# Patient Record
Sex: Female | Born: 1955
Health system: Southern US, Community
[De-identification: ages and names within clinical notes are randomized; demographics above are authoritative.]

## PROBLEM LIST (undated history)

## (undated) DIAGNOSIS — K5909 Other constipation: Secondary | ICD-10-CM

## (undated) DIAGNOSIS — T4145XA Adverse effect of unspecified anesthetic, initial encounter: Secondary | ICD-10-CM

## (undated) DIAGNOSIS — T8859XA Other complications of anesthesia, initial encounter: Secondary | ICD-10-CM

## (undated) DIAGNOSIS — F419 Anxiety disorder, unspecified: Secondary | ICD-10-CM

## (undated) DIAGNOSIS — K219 Gastro-esophageal reflux disease without esophagitis: Secondary | ICD-10-CM

## (undated) DIAGNOSIS — F32A Depression, unspecified: Secondary | ICD-10-CM

## (undated) DIAGNOSIS — F329 Major depressive disorder, single episode, unspecified: Secondary | ICD-10-CM

## (undated) DIAGNOSIS — I10 Essential (primary) hypertension: Secondary | ICD-10-CM

## (undated) HISTORY — PX: COLONOSCOPY: SHX174

## (undated) HISTORY — PX: ABDOMINAL HYSTERECTOMY: SHX81

## (undated) HISTORY — PX: KNEE ARTHROPLASTY: SHX992

## (undated) HISTORY — DX: Depression, unspecified: F32.A

## (undated) HISTORY — DX: Essential (primary) hypertension: I10

## (undated) HISTORY — PX: BREAST SURGERY: SHX581

## (undated) HISTORY — PX: FOOT SURGERY: SHX648

## (undated) HISTORY — DX: Gastro-esophageal reflux disease without esophagitis: K21.9

## (undated) HISTORY — DX: Major depressive disorder, single episode, unspecified: F32.9

## (undated) HISTORY — DX: Anxiety disorder, unspecified: F41.9

## (undated) HISTORY — PX: COLON SURGERY: SHX602

---

## 1998-03-12 ENCOUNTER — Ambulatory Visit (HOSPITAL_COMMUNITY): Admission: RE | Admit: 1998-03-12 | Discharge: 1998-03-12 | Payer: Self-pay | Admitting: *Deleted

## 1998-05-27 ENCOUNTER — Ambulatory Visit (HOSPITAL_COMMUNITY): Admission: RE | Admit: 1998-05-27 | Discharge: 1998-05-27 | Payer: Self-pay | Admitting: *Deleted

## 1998-06-04 ENCOUNTER — Ambulatory Visit (HOSPITAL_COMMUNITY): Admission: RE | Admit: 1998-06-04 | Discharge: 1998-06-04 | Payer: Self-pay | Admitting: *Deleted

## 1998-08-25 ENCOUNTER — Encounter: Payer: Self-pay | Admitting: *Deleted

## 1998-08-25 ENCOUNTER — Emergency Department (HOSPITAL_COMMUNITY): Admission: EM | Admit: 1998-08-25 | Discharge: 1998-08-25 | Payer: Self-pay | Admitting: Emergency Medicine

## 1999-03-04 ENCOUNTER — Ambulatory Visit (HOSPITAL_COMMUNITY): Admission: RE | Admit: 1999-03-04 | Discharge: 1999-03-04 | Payer: Self-pay | Admitting: *Deleted

## 1999-03-04 ENCOUNTER — Encounter: Payer: Self-pay | Admitting: *Deleted

## 1999-10-22 ENCOUNTER — Encounter (INDEPENDENT_AMBULATORY_CARE_PROVIDER_SITE_OTHER): Payer: Self-pay

## 1999-10-22 ENCOUNTER — Inpatient Hospital Stay (HOSPITAL_COMMUNITY): Admission: RE | Admit: 1999-10-22 | Discharge: 1999-10-25 | Payer: Self-pay | Admitting: Obstetrics and Gynecology

## 2000-12-30 ENCOUNTER — Emergency Department (HOSPITAL_COMMUNITY): Admission: EM | Admit: 2000-12-30 | Discharge: 2000-12-30 | Payer: Self-pay | Admitting: Emergency Medicine

## 2000-12-30 ENCOUNTER — Encounter: Payer: Self-pay | Admitting: Emergency Medicine

## 2001-04-22 ENCOUNTER — Ambulatory Visit (HOSPITAL_BASED_OUTPATIENT_CLINIC_OR_DEPARTMENT_OTHER): Admission: RE | Admit: 2001-04-22 | Discharge: 2001-04-22 | Payer: Self-pay | Admitting: Orthopedic Surgery

## 2001-08-09 ENCOUNTER — Encounter: Payer: Self-pay | Admitting: Internal Medicine

## 2001-08-09 ENCOUNTER — Encounter: Admission: RE | Admit: 2001-08-09 | Discharge: 2001-08-09 | Payer: Self-pay | Admitting: Internal Medicine

## 2003-09-28 ENCOUNTER — Encounter: Admission: RE | Admit: 2003-09-28 | Discharge: 2003-09-28 | Payer: Self-pay | Admitting: Emergency Medicine

## 2003-11-12 ENCOUNTER — Encounter: Admission: RE | Admit: 2003-11-12 | Discharge: 2003-11-12 | Payer: Self-pay | Admitting: Orthopaedic Surgery

## 2003-12-04 ENCOUNTER — Encounter: Admission: RE | Admit: 2003-12-04 | Discharge: 2003-12-04 | Payer: Self-pay | Admitting: Orthopaedic Surgery

## 2004-06-30 ENCOUNTER — Other Ambulatory Visit: Admission: RE | Admit: 2004-06-30 | Discharge: 2004-06-30 | Payer: Self-pay | Admitting: Obstetrics and Gynecology

## 2004-10-07 ENCOUNTER — Ambulatory Visit (HOSPITAL_COMMUNITY): Admission: RE | Admit: 2004-10-07 | Discharge: 2004-10-07 | Payer: Self-pay | Admitting: General Surgery

## 2004-10-07 ENCOUNTER — Ambulatory Visit (HOSPITAL_BASED_OUTPATIENT_CLINIC_OR_DEPARTMENT_OTHER): Admission: RE | Admit: 2004-10-07 | Discharge: 2004-10-07 | Payer: Self-pay | Admitting: General Surgery

## 2004-10-07 ENCOUNTER — Encounter (INDEPENDENT_AMBULATORY_CARE_PROVIDER_SITE_OTHER): Payer: Self-pay | Admitting: Specialist

## 2005-08-27 ENCOUNTER — Inpatient Hospital Stay (HOSPITAL_COMMUNITY): Admission: EM | Admit: 2005-08-27 | Discharge: 2005-08-31 | Payer: Self-pay | Admitting: Emergency Medicine

## 2005-09-25 ENCOUNTER — Ambulatory Visit: Payer: Self-pay | Admitting: Pulmonary Disease

## 2005-10-05 ENCOUNTER — Encounter: Admission: RE | Admit: 2005-10-05 | Discharge: 2005-10-05 | Payer: Self-pay | Admitting: Emergency Medicine

## 2005-10-26 ENCOUNTER — Ambulatory Visit: Payer: Self-pay | Admitting: Pulmonary Disease

## 2005-11-22 ENCOUNTER — Ambulatory Visit: Payer: Self-pay | Admitting: Pulmonary Disease

## 2005-11-22 ENCOUNTER — Ambulatory Visit (HOSPITAL_BASED_OUTPATIENT_CLINIC_OR_DEPARTMENT_OTHER): Admission: RE | Admit: 2005-11-22 | Discharge: 2005-11-22 | Payer: Self-pay | Admitting: Pulmonary Disease

## 2007-06-17 ENCOUNTER — Encounter: Admission: RE | Admit: 2007-06-17 | Discharge: 2007-06-17 | Payer: Self-pay | Admitting: Emergency Medicine

## 2009-05-09 ENCOUNTER — Encounter: Admission: RE | Admit: 2009-05-09 | Discharge: 2009-05-09 | Payer: Self-pay | Admitting: Allergy

## 2009-05-15 ENCOUNTER — Encounter: Admission: RE | Admit: 2009-05-15 | Discharge: 2009-05-15 | Payer: Self-pay | Admitting: Emergency Medicine

## 2009-05-16 ENCOUNTER — Encounter: Admission: RE | Admit: 2009-05-16 | Discharge: 2009-05-16 | Payer: Self-pay | Admitting: Emergency Medicine

## 2010-05-25 ENCOUNTER — Encounter: Payer: Self-pay | Admitting: Interventional Radiology

## 2010-05-25 ENCOUNTER — Encounter: Payer: Self-pay | Admitting: Emergency Medicine

## 2010-07-15 ENCOUNTER — Other Ambulatory Visit: Payer: Self-pay | Admitting: Gastroenterology

## 2010-07-15 ENCOUNTER — Ambulatory Visit
Admission: RE | Admit: 2010-07-15 | Discharge: 2010-07-15 | Disposition: A | Discharge status: Home / Self Care | Payer: Self-pay | Source: Ambulatory Visit | Attending: Gastroenterology | Admitting: Gastroenterology

## 2010-07-15 DIAGNOSIS — R110 Nausea: Secondary | ICD-10-CM

## 2010-07-15 MED ORDER — IOHEXOL 300 MG/ML  SOLN
100.0000 mL | Freq: Once | INTRAMUSCULAR | Status: AC | PRN
  Administered 2010-07-15: 100 mL via INTRAVENOUS

## 2010-07-18 ENCOUNTER — Other Ambulatory Visit: Payer: Self-pay | Admitting: Family Medicine

## 2010-07-18 DIAGNOSIS — N949 Unspecified condition associated with female genital organs and menstrual cycle: Secondary | ICD-10-CM

## 2010-07-22 ENCOUNTER — Ambulatory Visit
Admission: RE | Admit: 2010-07-22 | Discharge: 2010-07-22 | Disposition: A | Discharge status: Home / Self Care | Payer: Federal, State, Local not specified - PPO | Source: Ambulatory Visit | Attending: Family Medicine | Admitting: Family Medicine

## 2010-07-22 DIAGNOSIS — N949 Unspecified condition associated with female genital organs and menstrual cycle: Secondary | ICD-10-CM

## 2010-09-19 NOTE — Discharge Summary (Signed)
[facility] [place]  Patient:    Danielle Anthony, Danielle Anthony                      MRN: [id number] Adm. Date:  [id number] Disc. Date: 10/25/99 Attending:  Marcelle Overlie                           Discharge Summary  ADMISSION DIAGNOSIS:          Symptomatic uterine fibroids.  DISCHARGE DIAGNOSIS:          Symptomatic uterine fibroids.  HISTORY OF PRESENT ILLNESS:   The patient is a 55 year old, gravida 0, with a longstanding history of symptomatic fibroids, who complained of heavy menses with clots, lower abdominal, back, and leg pain, which had previously required orthopedic evaluation, as well as episodes of acute urinary retention.  Of note, the patient is a Curator and refuses blood transfusion.  ALLERGIES:                    None.  MEDICATIONS:                  None.  PAST MEDICAL HISTORY:         As above in the history of present illness.  FAMILY HISTORY:               Unremarkable.  HOSPITAL COURSE:              The patient underwent a total abdominal hysterectomy.  Her estimated blood loss was 450 cc at the time of surgery. The patient did very well postoperatively.  Her postoperative hematocrit settled at 29.  On postoperative day #2, the patient did have a low-grade temperature to 100.7 degrees.  Her physical exam was only remarkable for some upper respiratory congestion and the patient was started on amoxicillin, as well as a decongestant.  By postoperative day #3, the patient had been afebrile, had stable vital signs, was tolerating a regular diet, had flatus, had a small bowel movement, and was tolerating her pain medications well.  DISPOSITION:                  She was discharged home on postoperative day #3 in excellent condition.  She was advised to call if she has nausea, vomiting, abdominal pain, redness or drainage from incision site, or temperature greater than 100.5 degrees.  FOLLOW-UP:                    She will follow up in the  office in four weeks.  ACTIVITIES:                   She was advised no driving for two to three weeks.  DISCHARGE MEDICATIONS:        1. Demerol 50 mg one p.o. q.3-4h. p.r.n. pain,                                  #60.                               2. Amoxicillin 250 mg p.o. t.i.d. for seven  days, #21.                               3. Anaprox 275 mg p.o. b.i.d., #60. DD:  10/25/99 TD:  10/26/99 Job: [id number]

## 2010-09-19 NOTE — Op Note (Signed)
NAME:  Danielle Anthony, Danielle Anthony               ACCOUNT NO.:  [id number]   MEDICAL RECORD NO.:  [id number]          PATIENT TYPE:  AMB   LOCATION:  [facility]                         FACILITY:  [facility]   PHYSICIAN:  Adolph Pollack, M.D.DATE OF BIRTH:  04/23/1956   DATE OF PROCEDURE:  10/07/2004  DATE OF DISCHARGE:                                 OPERATIVE REPORT   PREOPERATIVE DIAGNOSIS:  Anorectal masses.   POSTOPERATIVE DIAGNOSIS:  Anorectal masses.   PROCEDURES:  1.  Exam under anesthesia.  2.  Anoscopy.  3.  Transanal excision of right rectal mass.  4.  Transanal excision of left anal mass.   SURGEON:  Adolph Pollack, M.D.   ANESTHESIA:  General plus 0.5% Marcaine with epinephrine local.   INDICATIONS:  This is a 55 year old female who had colorectal cancer  screening by Dr. Loreta Ave, and she had a prominent anal papilla and anal mass.  It was prolapsing.  Indeed, on exam in the office, she had this palpable  prolapsing mass on the right side and a smaller mass on the left, and she  now presents for transanal excision of these.  The procedure and risks were  discussed with her.   TECHNIQUE:  She was seen in the holding area, then brought to the operating  room, placed supine on the operating table, and a general anesthetic was  administered.  She was placed in the lithotomy position, and the perianal  area was sterilely prepped and draped.  A Betadine-soaked sponge was then  inserted into the rectum. Digital rectal exam was performed, and the left-  sided was palpated, and the right-sided mass was able to be brought through  the anus.  I addressed the left-sided mass first.  The anoscope was  inserted, and the approximately 8-10 mm mass was seen just distal to the  dentate line.  I anesthetized the mucosa and submucosa with the local  anesthetic and excised the mass with the cautery, and the base was  hemostatic.  The mass was sent to pathology.   Next, I placed the anoscope back in  and grasped the right-sided rectal-type  mass which looks like potentially hypertrophied rectal mucosa.  I went  ahead, and by way of the closed technique, I applied a Kelly clamp to its  base, excised it sharply, and then reapproximated the mucosa with a baseball-  type stitch for hemostasis as well as reapproximation.  This mass was sent  separately to pathology.  I did a perianal block with the local anesthetic  at that time.  The areas were hemostatic.  The sponge was removed from the  anus.  A piece of Gelfoam was placed into the anorectal canal.  A bulky  dressing was applied.   She tolerated the procedures well without any apparent complications.  She  will be given discharge instructions.  She was taken to the recovery room in  satisfactory condition and will follow up with me in the office in 1-2  weeks.      TJR/MEDQ  D:  10/07/2004  T:  10/07/2004  Job:  [id number]  cc:   Anselmo Rod, M.D.  [address]  [address]  [place]  [place] [address]  Fax: [phone]   Reuben Likes, M.D.  [address]  [place]  [place] [address]  Fax: [phone]

## 2010-09-19 NOTE — Discharge Summary (Signed)
NAME:  Danielle Anthony, Danielle Anthony NO.:  [id number]   MEDICAL RECORD NO.:  [id number]          PATIENT TYPE:  INP   LOCATION:  5708                         FACILITY:  [facility]   PHYSICIAN:  Angelia Mould. Derrell Lolling, M.D.DATE OF BIRTH:  1955/12/13   DATE OF ADMISSION:  08/26/2005  DATE OF DISCHARGE:  08/31/2005                                 DISCHARGE SUMMARY   DISCHARGE PHYSICIAN:  Angelia Mould. Derrell Lolling, M.D.   CHIEF COMPLAINT/REASON FOR ADMISSION:  Danielle Anthony is a 55 year old female  patient with a two day history of worsening rectal pain and low-grade  fevers.  She is also being treated outpatient for bronchitis and was on  Levaquin prior to admission.  She has been having small bowel movements that  have been extremely painful, and on passing mucous, she has had prior  transanal excision of benign rectal mass in January 2006 by Danielle Anthony.  She presented to the ER because of pain, and CT showed an ill-defined  perirectal densities consistent with perirectal abscess.  The patient also  has a fairly large ovarian cyst.   PHYSICAL EXAMINATION:  VITAL SIGNS:  Temperature 100, blood pressure 101/50,  heart rate 72, respirations 18.  Examination was unremarkable except for the  following.  PERIRECTAL:  No gross mass or fluctuance.  Patient mildly tender to  palpation in the left perianal region.  She was exquisitely tender with any  attempts at visual/rectal examination.  Because of this, Danielle Anthony was  unable to fully examine the area due to the fever, CT findings and white  count of 12,300.  Danielle Anthony best felt that direct examination under  anesthesia with possible drainage evac of abscess was indicated.   HISTORY OF PRESENT ILLNESS:  On the date of admission, the patient was taken  to the OR where she did undergo a drainage of an internal perirectal  abscess.  A large amount of foul smelling purulent material was evacuated.  Cavity was explored with a finger, and there was no  tracking noted.  The  area was cleansed with saline, and a Penrose drain was left in place.  The  patient was deferred to Danielle Anthony note for further details.  The patient  was subsequently transferred to the PACU for recovery and to the general  floor for admission.   ADMISSION DIAGNOSES:  1.  Deep perirectal abscess status post intraoperative drainage of same.  2.  Recent bronchitis on Levaquin.  3.  Hypertension.  4.  History of diverticulitis.  5.  Chronic constipation.  6.  Large ovarian cyst on CT scan.  Clarification:  There are large      bilateral ovarian cysts seen on CT scan.   HOSPITAL COURSE:  The patient was admitted to the General Medical floor  postoperatively where she was continued on IV fluids and IV Zosyn for  abscess.  She was continued on Avelox in substitution for her Levaquin for  her recent issues related to bronchitis.   On postop day #1, August 28, 2005, the patient spiked a temperature of 103.4  and was running temperatures of  around 101.4.  She had a wet sounding cough.  She was passing flatus.  The Penrose drain had fallen out.  There was no  drainage coming from the perirectal area.  She was somewhat tender of her  left gluteal region, but there was no fluctuance or mass.  Because of the  increasing fever, there was some concern that there may be an additional  pocket of perirectal abscess left.  A CT was done, and showed that the prior  area of abscess had subsequently resolved.  There were new patchy areas of  brown glass opacity in the right middle lobe and right lower lobe, possibly  affecting either edema or early infiltrate.  Based on these findings,  aggressive pulmonary toileting was initiated.   Throughout the remainder of the weekend, the patient continues to improve.  Her respiratory status improved.  She was eventually weaned off oxygen.  A  repeat chest x-ray done on August 29, 2005, showed no active lung disease.  By August 31, 2005, the  patient had no further perirectal pain.  She was  tolerating sitz baths.  Blood cultures were negative.  Urine cultures were  negative.  White count by August 29, 2005, was down to 4300.  The patient was  afebrile.  The only complaint up to this point was a two day history of  increasing frequency of diarrhea and loose stools.  Painless in nature.  No  blood.  It was felt at this time that the patient was probably having  diarrhea secondary to combination of Avelox and Zosyn therapy.  Since she  had no fever or problems with leukocytosis, it was felt that she probably  did not have C. difficile colitis, but specimens were obtained prior to her  discharge.  IV fluids were originally increased to 100 an hour to aid in  hydration, and the patient was able to tolerate a diet without nausea or  vomiting.  Her Zosyn and Avelox were discontinued.  She was started on Cipro  and Flagyl with Cipro to continue for three days after discharge and Flagyl  for several days longer for a total of seven days duration to help cover not  only anaerobes from the perirectal abscess but to cover for C. difficile  colitis empirically as well.   FINAL DISCHARGE DIAGNOSES:  1.  Rectal pain due to perirectal abscess, deep, status post I&D of same on      August 27, 2005.  2.  Fever and cough with recent acute bronchitis and exacerbation of same in      hospital, now resolved.  3.  Leukocytosis, resolved.  4.  Hypertension, controlled.  5.  Bilateral ovarian cyst seen on CT scan.   DISCHARGE MEDICATIONS:  1.  The patient will resume home medications as prior to admission, except,      she has been instructed to stop Levaquin.  2.  She will be on Cipro 500 mg b.i.d. for three more days.  3.  She will be on Flagyl 500 mg t.i.d. for seven days.   DISCHARGE INSTRUCTIONS:  1.  Diet:  No restrictions.  2.  Activity:  No shower or bath. 3.  She will return to work in one week if her diarrhea is better.  4.  Wound  care:  She is to continue Sitz baths t.i.d.  There is no drainage      and no areas to pack since the I&D itself was intrarectal.  Pain  management was Tylenol or ibuprofen over-the-counter.   FOLLOWUP:  She has an appointment to see Danielle Anthony on Monday, May 7, at  11:30 in the morning.  The patient has been instructed, as well, to call if  she has worsening diarrhea, dizziness and weakness or increasing fever.  That could indicated that there is something more serious occurring with the  diarrhea process.     Allison L. Rennis Harding, N.P.      Angelia Mould. Derrell Lolling, M.D.  Electronically Signed   ALE/MEDQ  D:  08/31/2005  T:  09/01/2005  Job:  [id number]

## 2010-09-19 NOTE — Op Note (Signed)
[facility]  Patient:    Danielle Anthony, Danielle Anthony Visit Number: [id number] MRN: [id number]          Service Type: DSU Location: [facility] Attending Physician:  Milly Jakob Dictated by:   Harvie Junior, M.D. Proc. Date: 04/22/01 Admit Date:  04/22/2001                             Operative Report  PREOPERATIVE DIAGNOSIS:  Medial meniscal tear.  POSTOPERATIVE DIAGNOSES: 1. Medial meniscal tear. 2. Osteochondral injury of medial femoral condyle. 3. Chondromalacia of patella.  PRINCIPAL PROCEDURES: 1. Debridement of posterior horn and mid body of medial meniscus. 2. Debridement of chondromalacia of medial femoral condyle. 3. Debridement of under surface of patella.  SURGEON:  Harvie Junior, M.D.  ASSISTANT:  Currie Paris. Thedore Mins.  ANESTHESIA:  General.  BRIEF HISTORY:  The patient is a 55 year old female with a long history of having had intermittent knee effusion with pain in the knee and popping on the medial side.  She was examined in the office and felt to have a medial meniscal tear.  She is taken to the operating room for evaluation and treatment of this.  DESCRIPTION OF PROCEDURE:  The patient was taken to the operating room and adequate anesthesia was obtained with general anesthetic.  The patient was placed supine on the operating table.  The right leg was then prepped and draped in the usual sterile fashion, and following this, routine arthroscopic examination of the knee revealed there was an obvious tear of the medial meniscus on the __________ side.  This was debrided back to a smooth and stable rim.  Attention was then turned towards the medial femoral condyle which had some grade 2 changes which were debrided.  Attention was then turned up into the patellofemoral joint where there was noted to be some grade 2 and 3 changes on the under surface of the patella. The notch was identified and noted to have a normal ACL and PCL.   The lateral joint was then identified and noted to be within normal limits.  Following this, the patella was debrided back to a smooth and stable rim.  The knee was then copiously irrigated, suctioned dry, and a final check was made for any loose fragment pieces and none were seen.  At this point, the knee was copiously irrigated and suctioned dry.  Portals were closed with a bandage and a sterile compressive dressing applied.  The patient was taken to the recovery room where she was noted to be in satisfactory condition.  Estimated blood loss for the procedure was none. Dictated by:   Harvie Junior, M.D. Attending Physician:  Milly Jakob DD:  04/22/01 TD:  04/24/01 Job: [id number]

## 2010-09-19 NOTE — Procedures (Signed)
NAME:  Danielle Anthony, Danielle Anthony               ACCOUNT NO.:  [id number]   MEDICAL RECORD NO.:  [id number]            PATIENT TYPE:   LOCATION:  SLEEP CENTER                   FACILITY:   PHYSICIAN:  Coralyn Helling, M.D.      DATE OF BIRTH:  Oct 31, 1955   DATE OF STUDY:                              NOCTURNAL POLYSOMNOGRAM   DATE OF EXAMINATION:  November 22, 2005.   FACILITY:  [facility]   REFERRING PHYSICIAN:  Dr. Coralyn Helling.   INDICATIONS FOR STUDY:  This is an individual who has loud snoring, daytime  sleepiness, and presents to the sleep lab for evaluation of obstructive  sleep apnea.   MEDICATIONS:  Benicar, Ambien, and citalopram.   EPWORTH SLEEPINESS SCORE:  Epworth score is 3.   SLEEP ARCHITECTURE:  Sleep architecture total recording time was 439  minutes. Total sleep time was 257 minutes. Sleep efficiency was 59% which is  reduced, although it appeared that the patient had frequent respiratory  events as cause of her sleep disruption and difficulty with maintaining  sleep. The patient was observed in all stages of sleep. However, there was a  reduction of percentage of slow wave sleep at 15% of the study and a  reduction in the percentage of REM sleep at 13% of the study. Sleep latency  was 62 minutes which was prolonged. REM latency was 342.5 minutes which was  prolonged. The  patient spent the majority of time in the nonsupine  position.   RESPIRATORY DATA:  A split night protocol was ordered. However, the patient  did not meet split night criteria. The average respiratory rate was 14. The  apnea hypopnea index was 13.5. The events were exclusively obstructive in  nature. There was a significant REM effect with a REM apnea hypopnea index  of 67.8 versus a non-REM apnea hypopnea index of 5.1. Moderate snoring was  noted by the technician.   OXYGEN DATA:  The oxygen saturation nadir was 89%. The baseline oxygenation  was 98%. The patient spent a total of 422.5 minutes with  an oxygen  saturation between 91 and 100% and 0.2 minutes with an oxygen saturation  between 81 and 90%.   CARDIAC DATA:  EKG showed normal sinus rhythm.   MOVEMENT PARASOMNIA:  The periodic limb movement index was 4.9.   IMPRESSION:  This study shows evidence for moderate obstructive sleep apnea  with an apnea hypopnea index of 13.5 with an oxygen saturation nadir of 89%.  Of note is that there was a significant REM effect. Additionally the  patient had minimal amount of REM sleep, and therefore the severity of sleep  apnea may be underestimated. She also appeared to have frequent sleep  disruption related to respiratory events.      Coralyn Helling, M.D.  Diplomat, Biomedical engineer of Sleep Medicine  Electronically Signed     VS/MEDQ  D:  11/30/2005 11:51:08  T:  11/30/2005 13:01:21  Job:  [id number]

## 2010-09-19 NOTE — Op Note (Signed)
[facility]  Patient:    Danielle Anthony, Danielle Anthony                      MRN: [id number] Proc. Date: 10/22/99 Adm. Date:  [id number] Attending:  Marcelle Overlie                           Operative Report  PREOPERATIVE DIAGNOSIS:       Symptomatic uterine fibroids.  POSTOPERATIVE DIAGNOSIS:      Symptomatic uterine fibroids.  PROCEDURE:                    Total abdominal hysterectomy.  SURGEON:                      Marcelle Overlie, M.D.  ASSISTANT:                    Luvenia Redden, M.D.  ANESTHESIA:                   General anesthesia.  ESTIMATED BLOOD LOSS:         450 cc.  PATHOLOGY:                    Uterus and cervix.  COMPLICATIONS:                None.  DRAINS:                       Foley catheter.  FINDINGS:                     A 16 week size fibroid uterus.  DESCRIPTION OF PROCEDURE:     The patient was taken to the operating room where she was intubated without difficulty.  She was supine on the operating table.  A Foley catheter was placed in the bladder.  The abdomen was prepped and draped in the usual sterile fashion.  Using a scalpel, a small low transverse incision was made and carried down to the fascia using electrocautery with good hemostasis.  The fascia was scored in the midline and then extended laterally using Mayo scissors. A Pfannenstiel incision was created.  The rectus muscles were dissected in the middle and the peritoneum was lifted up and picked up using Metzenbaum scissors.  The peritoneal incision was then extended superiorly and inferiorly with good visualization of the bladder.  Inspection on palpation, the pelvis revealed a grossly distorted uterus which was approximately 16 weeks in size.  There was a  very large 10 cm fibroid protruding anteriorly and the adnexa were not easily visualized, but were posterior to the uterus.  We then transected the round ligament on the right side and the anterior leaf of the  broad ligament was then  carried down to the level of the cervix.  It was then decided to proceed with a  myomectomy in order to proceed with better visualization of her pedicles. Using electrocautery and blunt and sharp dissection, a myomectomy was performed with removal of the 10 cm fibroid as well as the three smaller fibroids.  This was done and after that there was much better visualization of the pelvis.  We then proceeded with turning our attention to the left side of the pelvis where the round ligament was identified.  The round ligament was transected and ligated using 0  Vicryl suture.  Anterior and posterior leafs of the broad ligament were incised. The ureter was identified in its normal location.  The ovary was also noted to e slightly adherent to a large posterior fibroid as well as surrounding peritoneum and these adhesions were then freed using blunt and sharp dissection.  The triple pedicle was then clamped using Mayo scissors and the pedicle was cut and suture  ligated using 0 Vicryl suture.  We then traveled around the fibroid using a series of clamps and suture ligature using 0 Vicryl suture on the left side. Attention was then turned to the right side where in a similar fashion, we identified the  ovary and tube on the right side and both were covered with peritoneum.  The ureter was identified in its normal location and parastalsis was identified on the right side and triple pedicle was then clamped on the right side and the pedicle was hen cut and suture ligated using 0 Vicryl suture.  We then turned attention to our cervix and the bladder was dissected away sharply and bluntly anteriorly from the cervix.  The uterine arteries were clamped on both sides using curved Heaney clamps and then the pedicles were secured using suture ligature using 0 Vicryl suture. We then used one clamp on each side to remove the cervix and this incorporated  the  uterosacral cardinal complex and those pedicles were cut and suture ligated using 0 Vicryl suture.  Jorgensen sutures were used to remove the cervix.  The specimen was removed and set aside and involved a series of fibroids that loosely removed as  well as the uterus and cervix.  The vaginal cuff was then closed with angle stitches using the Richardson method using 0 Vicryl suture and the remainder of the cuff was closed with three figure-of-eights using 0 Vicryl suture.  Irrigation as performed.  All pedicles were inspected and noted to be hemostatic.  The laparotomy pads and retractor was then removed from the abdomen.  The peritoneum was closed using 0 Vicryl in continuous running locked stitch.  The fascia was closed using 0 Vicryl in a continuous running stitch x 2 starting at each corner and meeting in the midline.  After irrigation of the subcutaneous layer, the skin was closed with staples.  All sponge, needle, and instrument counts were correct x 2.  The patient tolerated the procedure well and went to the recovery room in stable condition. DD:  10/22/99 TD:  10/23/99 Job: [id number]

## 2010-09-19 NOTE — Op Note (Signed)
NAME:  DEEANA, ATWATER               ACCOUNT NO.:  [id number]   MEDICAL RECORD NO.:  [id number]          PATIENT TYPE:  INP   LOCATION:  2550                         FACILITY:  [facility]   PHYSICIAN:  Wilmon Arms. Corliss Skains, M.D. DATE OF BIRTH:  [birth year]   DATE OF PROCEDURE:  08/27/2005  DATE OF DISCHARGE:                                 OPERATIVE REPORT   PREOPERATIVE DIAGNOSIS:  Perirectal abscess.   POSTOPERATIVE DIAGNOSIS:  Perirectal abscess.   PROCEDURE PERFORMED:  Examination under anesthesia with drainage of internal  perirectal abscess.   SURGEON:  Wilmon Arms. Tsuei, M.D.   ANESTHESIA:  General endotracheal.   INDICATIONS:  The patient is a 55 year old female who presents with a 2-day  history of worsening severe left perirectal pain.  The patient presented to  the emergency department for evaluation.  There was no abscess or fluctuance  was noted externally.  The patient was exquisitely painful on digital rectal  examination.  A CT scan was performed which showed a vague fluid density to  the left of the rectum consistent with a perirectal abscess.  Surgery was  then consulted.  The decision was made to proceed to the operating room for  examination under anesthesia and drainage.   DESCRIPTION OF PROCEDURE:  The patient was brought to the operating and  placed in the supine position on the operating table.  After an adequate  level of general endotracheal anesthesia was obtained, her legs were placed  in the lithotomy position in yellow thin stirrups.  Her perineum was then  prepped with Betadine and draped in a sterile fashion.  The silver bullet  retractor was inserted in the rectum.  Circumferential examination showed a  very firm erythematous lesion on the left side measuring several cm in  diameter.  The remainder of the rectum appeared normal.  An 18 gauge needle  was inserted into this fluctuant mass and pus was aspirated.  Cautery was  then used to make a 2 cm opening  over this fluctuant site.  A large amount  of foul-smelling purulent material was evacuated.  The cavity was explored  with a finger and did not appear to track superiorly.  The wound was then  thoroughly irrigated with saline.  A Penrose drain was then inserted into  the abscess cavity.  The tail of the Penrose was left hanging out of the  anus.  The retractor was removed.  An ABD pad was placed on the perineum and  the legs were moved down out of lithotomy position.  The patient was then  extubated and brought to the recovery in stable condition.  All sponges,  instrument, and needle counts correct.      Wilmon Arms. Tsuei, M.D.  Electronically Signed     MKT/MEDQ  D:  08/27/2005  T:  08/27/2005  Job:  [id number]

## 2010-09-19 NOTE — H&P (Signed)
NAME:  Danielle Anthony, Danielle Anthony NO.:  [id number]   MEDICAL RECORD NO.:  [id number]          Anthony TYPE:  EMS   LOCATION:  MAJO                         FACILITY:  [facility]   PHYSICIAN:  Wilmon Arms. Corliss Skains, M.D. DATE OF BIRTH:  29-Aug-1955   DATE OF ADMISSION:  08/27/2005  DATE OF DISCHARGE:                                HISTORY & PHYSICAL   CHIEF COMPLAINT:  Rectal pain.   HISTORY OF PRESENT ILLNESS:  Danielle Anthony is a 55 year old female who  presents with a 2-day history of worsening rectal pain and low-grade fever.  Her bowel movements have been small and extremely painful.  She is passing  only a little bit of mucus.  [facility]le Anthony reports that in June of 2006, Dr.  Abbey Chatters performed a transanal excision of a benign rectal mass.  [facility]le  Anthony was evaluated in [facility] emergency department and a CT scan showed some  ill-defined perirectal densities consistent with a perirectal abscess.  Danielle  Anthony also has a fairly large ovarian cyst which is a bit unusual at her  age.   ALLERGIES:  None.   MEDICATIONS:  Benicar HCT and [facility] Anthony just finished a steroid taper for  bronchitis.   PAST MEDICAL HISTORY:  1.  Bronchitis.  2.  Hypertension.  3.  History of diverticulitis.  4.  Chronic constipation.   PAST SURGICAL HISTORY:  1.  Total abdominal hysterectomy.  2.  Knee surgery.  3.  Transanal excision of a rectal mass in 2006.   SOCIAL HISTORY:  Nonsmoker and nondrinker.   REVIEW OF SYSTEMS:  Review of systems otherwise negative.   PHYSICAL EXAMINATION:  VITAL SIGNS:  Temperature is 100.0, heart rate 72,  respirations 18, blood pressure 101/50.  GENERAL:  This is an obese African American female in no apparent distress.  She is lying quietly on her side.  It is too painful to lie on her back.  HEENT:  EOMI.  Sclerae anicteric.  NECK:  No mass or thyromegaly.  LUNGS:  Clear to auscultation bilaterally with normal respiratory effort.  HEART:  Regular rate and rhythm.   No murmurs.  ABDOMEN:  Soft and nontender.  RECTAL:  Perirectal examination shows no gross mass or fluctuance.  [facility]le  Anthony is mildly tender to palpation in [facility]le left perianal region.  She is  exquisitely tender with any attempts at a digital rectal examination.  I am  unable to get an adequate examination with [facility]le Anthony awake.   LABORATORY DATA:  Electrolytes within normal limits.  White count 12.3,  hemoglobin 12.6.   IMPRESSION:  High perirectal abscess as confirmed by CT scan of Danielle pelvis.   PLAN:  Examination under anesthesia with drainage of [facility] abscess.      Wilmon Arms. Tsuei, M.D.  Electronically Signed     MKT/MEDQ  D:  08/27/2005  T:  08/27/2005  Job:  [id number]

## 2010-12-27 ENCOUNTER — Other Ambulatory Visit (HOSPITAL_COMMUNITY): Payer: Federal, State, Local not specified - PPO

## 2010-12-27 ENCOUNTER — Other Ambulatory Visit: Payer: Self-pay | Admitting: Family Medicine

## 2010-12-27 DIAGNOSIS — R112 Nausea with vomiting, unspecified: Secondary | ICD-10-CM

## 2011-05-14 ENCOUNTER — Ambulatory Visit (INDEPENDENT_AMBULATORY_CARE_PROVIDER_SITE_OTHER): Payer: Federal, State, Local not specified - PPO

## 2011-05-14 DIAGNOSIS — R059 Cough, unspecified: Secondary | ICD-10-CM

## 2011-05-14 DIAGNOSIS — J40 Bronchitis, not specified as acute or chronic: Secondary | ICD-10-CM

## 2011-05-14 DIAGNOSIS — R05 Cough: Secondary | ICD-10-CM

## 2011-06-22 ENCOUNTER — Other Ambulatory Visit: Payer: Self-pay | Admitting: Family Medicine

## 2011-06-22 MED ORDER — AMLODIPINE BESYLATE 10 MG PO TABS: 10.0000 mg | ORAL_TABLET | Freq: Every day | ORAL | Status: DC

## 2011-07-22 ENCOUNTER — Other Ambulatory Visit: Payer: Self-pay | Admitting: Family Medicine

## 2011-07-22 MED ORDER — CITALOPRAM HYDROBROMIDE 10 MG PO TABS: 10.0000 mg | ORAL_TABLET | Freq: Every day | ORAL | Status: DC

## 2011-08-14 ENCOUNTER — Ambulatory Visit (INDEPENDENT_AMBULATORY_CARE_PROVIDER_SITE_OTHER): Payer: Federal, State, Local not specified - PPO

## 2011-08-15 ENCOUNTER — Ambulatory Visit (INDEPENDENT_AMBULATORY_CARE_PROVIDER_SITE_OTHER): Payer: Federal, State, Local not specified - PPO | Admitting: Family Medicine

## 2011-08-15 VITALS — BP 143/84 | HR 76 | Temp 98.9°F | Resp 16 | Ht 64.75 in | Wt 194.0 lb

## 2011-08-15 DIAGNOSIS — I10 Essential (primary) hypertension: Secondary | ICD-10-CM

## 2011-08-15 DIAGNOSIS — R51 Headache: Secondary | ICD-10-CM

## 2011-08-15 LAB — POCT CBC
Granulocyte percent: 58.1 %G (ref 37–80)
MID (cbc): 0.4 (ref 0–0.9)
MPV: 8.9 fL (ref 0–99.8)
POC MID %: 7.5 %M (ref 0–12)
Platelet Count, POC: 334 10*3/uL (ref 142–424)
RBC: 4.65 M/uL (ref 4.04–5.48)

## 2011-08-15 LAB — COMPREHENSIVE METABOLIC PANEL
ALT: 11 U/L (ref 0–35)
AST: 16 U/L (ref 0–37)
Albumin: 4.5 g/dL (ref 3.5–5.2)
Calcium: 9.8 mg/dL (ref 8.4–10.5)
Chloride: 101 mEq/L (ref 96–112)
Creat: 0.75 mg/dL (ref 0.50–1.10)
Potassium: 4 mEq/L (ref 3.5–5.3)

## 2011-08-15 MED ORDER — CYCLOBENZAPRINE HCL 5 MG PO TABS: 5.0000 mg | ORAL_TABLET | Freq: Three times a day (TID) | ORAL | Status: AC | PRN

## 2011-08-15 MED ORDER — KETOROLAC TROMETHAMINE 60 MG/2ML IM SOLN
60.0000 mg | Freq: Once | INTRAMUSCULAR | Status: AC
  Administered 2011-08-15: 60 mg via INTRAMUSCULAR

## 2011-08-15 NOTE — Progress Notes (Signed)
  Subjective:    Patient ID: Danielle Anthony, female    DOB: 08/05/1955, 56 y.o.   MRN: [id number]  HPI 56 yo female with hypertension here with headache. Started Wed morning (3 days ago).  Was frontal, then posterior/right side, worsened Friday.  Ibuprofen and benadryl help, but still hard to sleep due to the pain.  Has slight cough, no runny nose.  Ears hurt - pressure.  Doesn't frequently get headaches.  Last time her head hurt like this she ended up breaking out in shingles.   Pain going around her head "like a halo" now.  Some dizziness.  No nausea.  Photophobia but no phonophobia.  NO fever.  TTP in posterior neck and hurts to bend head back.  No stiffness.    Review of Systems Negative except as per HPI     Objective:   Physical Exam  Constitutional: Vital signs are normal. She appears well-developed and well-nourished. She is active and cooperative.  HENT:  Head: Normocephalic.  Right Ear: Hearing, tympanic membrane, external ear and ear canal normal.  Left Ear: Hearing, tympanic membrane, external ear and ear canal normal.  Nose: Nose normal.  Mouth/Throat: Uvula is midline, oropharynx is clear and moist and mucous membranes are normal.  Eyes: Conjunctivae, EOM and lids are normal. Pupils are equal, round, and reactive to light. Right eye exhibits no nystagmus. Left eye exhibits no nystagmus.  Cardiovascular: Normal rate, regular rhythm, normal heart sounds, intact distal pulses and normal pulses.   Pulmonary/Chest: Effort normal and breath sounds normal.  Neurological: She is alert. She has normal strength. No cranial nerve deficit. She displays a negative Romberg sign. Coordination and gait normal.   Results for orders placed in visit on 08/15/11  POCT CBC      Component Value Range   WBC 5.4  4.6 - 10.2 (K/uL)   Lymph, poc 1.9  0.6 - 3.4    POC LYMPH PERCENT 34.4  10 - 50 (%L)   MID (cbc) 0.4  0 - 0.9    POC MID % 7.5  0 - 12 (%M)   POC Granulocyte 3.1  2 - 6.9    Granulocyte percent 58.1  37 - 80 (%G)   RBC 4.65  4.04 - 5.48 (M/uL)   Hemoglobin 12.7  12.2 - 16.2 (g/dL)   HCT, POC 44.0  34.7 - 47.9 (%)   MCV 82.7  80 - 97 (fL)   MCH, POC 27.3  27 - 31.2 (pg)   MCHC 33.0  31.8 - 35.4 (g/dL)   RDW, POC 42.5     Platelet Count, POC 334  142 - 424 (K/uL)   MPV 8.9  0 - 99.8 (fL)          Assessment & Plan:  Headache - has had before though not often.  White count normal.  TTP over neck musculature though neck is supple.  Discussed head CT.  Patient prefers to try pain med first.  Discussed if not improved by tomorrow morning, call or RTC to obtain head CT.  If worsens, to ER for eval.   Toradol here. Flexeril at home.  Heating pad.

## 2011-09-29 ENCOUNTER — Other Ambulatory Visit: Payer: Self-pay | Admitting: Physician Assistant

## 2011-09-29 MED ORDER — AMLODIPINE BESYLATE 10 MG PO TABS: 10.0000 mg | ORAL_TABLET | Freq: Every day | ORAL | Status: DC

## 2011-10-01 ENCOUNTER — Telehealth: Payer: Self-pay

## 2011-10-01 NOTE — Telephone Encounter (Signed)
Pt would like a refill on the HCTZ.

## 2011-10-02 MED ORDER — HYDROCHLOROTHIAZIDE 25 MG PO TABS: 25.0000 mg | ORAL_TABLET | Freq: Every day | ORAL | Status: DC

## 2011-10-02 NOTE — Telephone Encounter (Signed)
Done

## 2011-10-03 NOTE — Telephone Encounter (Signed)
lmom that rx was sent in

## 2011-11-28 ENCOUNTER — Other Ambulatory Visit: Payer: Self-pay | Admitting: Physician Assistant

## 2011-12-27 ENCOUNTER — Other Ambulatory Visit: Payer: Self-pay | Admitting: Physician Assistant

## 2012-04-09 ENCOUNTER — Other Ambulatory Visit: Payer: Self-pay | Admitting: Physician Assistant

## 2014-02-25 ENCOUNTER — Ambulatory Visit (INDEPENDENT_AMBULATORY_CARE_PROVIDER_SITE_OTHER): Payer: Federal, State, Local not specified - PPO | Admitting: Family Medicine

## 2014-02-25 VITALS — BP 156/88 | HR 84 | Temp 98.8°F | Resp 21 | Ht 64.0 in | Wt 205.2 lb

## 2014-02-25 DIAGNOSIS — R05 Cough: Secondary | ICD-10-CM

## 2014-02-25 DIAGNOSIS — I10 Essential (primary) hypertension: Secondary | ICD-10-CM

## 2014-02-25 DIAGNOSIS — R519 Headache, unspecified: Secondary | ICD-10-CM

## 2014-02-25 DIAGNOSIS — R0789 Other chest pain: Secondary | ICD-10-CM

## 2014-02-25 DIAGNOSIS — R059 Cough, unspecified: Secondary | ICD-10-CM

## 2014-02-25 DIAGNOSIS — R51 Headache: Secondary | ICD-10-CM

## 2014-02-25 MED ORDER — DOXYCYCLINE HYCLATE 100 MG PO TABS
100.0000 mg | ORAL_TABLET | Freq: Two times a day (BID) | ORAL | Status: DC
Start: 2014-02-25 — End: 2014-12-25

## 2014-02-25 MED ORDER — HYDROCODONE-HOMATROPINE 5-1.5 MG/5ML PO SYRP: 5.0000 mL | ORAL_SOLUTION | Freq: Four times a day (QID) | ORAL | Status: DC | PRN

## 2014-02-25 NOTE — Progress Notes (Signed)
Subjective:    Patient ID: Danielle Anthony, female    DOB: 1955-05-19, 58 y.o.   MRN: [id number]  HPI Chief Complaint  Patient presents with  . Cough    since wednesday, green phlegm   . Abdominal Pain    radiates to back from coughing  . Headache    like a "halo" headache   This chart was scribed for Danielle StaggersJeffrey [name] Lehenbauer, MD by Andrew Auaven Small, ED Scribe. This patient was seen in room 13 and the patient's care was started at 2:18 PM.  HPI Comments: Danielle PayorBarbara K Anthony is a 58 y.o. female who presents to the [facility] complaining of a harsh productive cough consisting of green mucous that began 4 days ago. Pt reports symptoms started with cough but now has associated rib soreness, SOB with cough, not with activity, postnasal drip and HA. Pt has not been able to sleep due to cough. Pt has taken dimetapp, 2 Tessalon Perles and ibuprofen 800mg  3-4 times a day without relief to pain. 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 Pt report hx HTN but has not taken medication.   Patient Active Problem List   Diagnosis Date Noted  . HTN (hypertension) 08/15/2011   Past Medical History  Diagnosis Date  . Anxiety   . Depression   . GERD (gastroesophageal reflux disease)   . Hypertension    Past Surgical History  Procedure Laterality Date  . Breast surgery    . Colon surgery    . Abdominal hysterectomy     No Known Allergies Prior to Admission medications   Medication Sig Start Date End Date Taking? Authorizing Provider  amLODipine (NORVASC) 10 MG tablet TAKE 1 TABLET (10 MG TOTAL) BY MOUTH DAILY. 12/27/11  Yes Danielle S Jeffery, PA-C  amLODipine (NORVASC) 10 MG tablet TAKE 1 TABLET (10 MG TOTAL) BY MOUTH DAILY. 04/09/12  Yes Danielle M Dunn, PA-C  citalopram (CELEXA) 20 MG tablet Take 20 mg by mouth daily.   Yes Historical Provider, MD  clonazePAM (KLONOPIN) 0.5 MG tablet Take 0.5 mg by mouth 2 (two) times daily as needed for anxiety.   Yes Historical Provider,  MD  hydrochlorothiazide (HYDRODIURIL) 25 MG tablet TAKE 1 TABLET (25 MG TOTAL) BY MOUTH DAILY. 11/28/11  Yes Marzella SchleinAngela M McClung, PA-C  citalopram (CELEXA) 10 MG tablet Take 1 tablet (10 mg total) by mouth daily. NEEDS OFFICE VISIT FOR MORE 07/22/11 07/21/12  Danielle RiddleSarah L Weber, PA-C   History   Social History  . Marital Status: Married    Spouse Name: N/A    Number of Children: N/A  . Years of Education: N/A   Occupational History  . Not on file.   Social History Main Topics  . Smoking status: Never Smoker   . Smokeless tobacco: Never Used  . Alcohol Use: Yes     Comment: wine/beer 2-3 x's/week  . Drug Use: No  . Sexual Activity: Not on file   Other Topics Concern  . Not on file   Social History Narrative  . No narrative on file   Review of Systems  Constitutional: Negative for fever and chills.  HENT: Positive for postnasal drip.   Respiratory: Positive for cough and shortness of breath.   Neurological: Positive for headaches.       Objective:   Physical Exam  Vitals reviewed. Constitutional: She is oriented to person, place, and time. She appears well-developed and well-nourished. No distress.  HENT:  Head: Normocephalic  and atraumatic.  Right Ear: Hearing, tympanic membrane, external ear and ear canal normal.  Left Ear: Hearing, tympanic membrane, external ear and ear canal normal.  Nose: Nose normal.  Mouth/Throat: Oropharynx is clear and moist. No oropharyngeal exudate.  Eyes: Conjunctivae and EOM are normal. Pupils are equal, round, and reactive to light.  Cardiovascular: Normal rate, regular rhythm, normal heart sounds and intact distal pulses.   No murmur heard. Pulmonary/Chest: Effort normal and breath sounds normal. No respiratory distress. She has no wheezes. She has no rhonchi. She has no rales. She exhibits no tenderness.  Diffuse intercostal TTP but no focal tenderness bilateral, no rash.  Neurological: She is alert and oriented to person, place, and time. Gait  normal.  Neuro exam non focal. Normal gait.  Skin: Skin is warm and dry. No rash noted.  Psychiatric: She has a normal mood and affect. Her behavior is normal.   Filed Vitals:   02/25/14 1400  BP: 156/88  Pulse: 84  Temp: 98.8 F (37.1 C)  TempSrc: Oral  Resp: 21  Height: 5\' 4"  (1.626 m)  Weight: 205 lb 3.2 oz (93.078 kg)  SpO2: 97%      Assessment & Plan:   LETISHA YERA is a 58 y.o. female Cough - Plan: HYDROcodone-homatropine (HYCODAN) 5-1.5 MG/5ML syrup, doxycycline (VIBRA-TABS) 100 MG tablet  URI with spread to LRTI/early bronchitis. Reassuring vitals and pulse ox. Sx care, mucinex or hycodan for cough suppression- SED, and has tessalon if needed. Can start doxy if not improving in 3-4 days. rtc sooner if worse.    Chest wall pain  -from cough. Sx care and NSAID if needed, but discussed effects on HTN, and not to exceed 800mg  Ibuprofen Q8hr.  Essential hypertension  -elevated in office off med - restart usual med.   Nonintractable headache, unspecified chronicity pattern, unspecified headache type  -HA with cough, also slight increased BP off meds. nonfocal exam. Restart antihypertensives. RTC if persists or worsens - ER/RTC precautions.   Meds ordered this encounter  Medications  . citalopram (CELEXA) 20 MG tablet    Sig: Take 20 mg by mouth daily.  . clonazePAM (KLONOPIN) 0.5 MG tablet    Sig: Take 0.5 mg by mouth 2 (two) times daily as needed for anxiety.  Marland Kitchen HYDROcodone-homatropine (HYCODAN) 5-1.5 MG/5ML syrup    Sig: Take 5 mLs by mouth every 6 (six) hours as needed.    Dispense:  120 mL    Refill:  0  . doxycycline (VIBRA-TABS) 100 MG tablet    Sig: Take 1 tablet (100 mg total) by mouth 2 (two) times daily.    Dispense:  20 tablet    Refill:  0   Patient Instructions  Restart your blood pressure medicine. Keep a record of your blood pressures outside of the office and if persisting over 140/90 - recheck to discuss your medicine. Headache is likely due to  cough, and elevated blood pressure, but if this is not improving with treating these two conditions - can recheck here or emergency room if needed if any worsening.  Do not exceed 800mg  ibuprofen every 8 hours - only use this as needed as it can also elevate your blood pressure.  Tessalon or mucinex DM during the day for cough, and cough syrup at night if needed.  You can use the cough syrup during the day if needed, but it can make you drowsy.   If cough not improving into next week, can start antibiotic for early bronchitis, but  your infection appears to be due to a virus at this time.  Return to the clinic or go to the nearest emergency room if any of your symptoms worsen or new symptoms occur.  Cough, Adult  A cough is a reflex that helps clear your throat and airways. It can help heal the body or may be a reaction to an irritated airway. A cough may only last 2 or 3 weeks (acute) or may last more than 8 weeks (chronic).  CAUSES Acute cough:  Viral or bacterial infections. Chronic cough:  Infections.  Allergies.  Asthma.  Post-nasal drip.  Smoking.  Heartburn or acid reflux.  Some medicines.  Chronic lung problems (COPD).  Cancer. SYMPTOMS   Cough.  Fever.  Chest pain.  Increased breathing rate.  High-pitched whistling sound when breathing (wheezing).  Colored mucus that you cough up (sputum). TREATMENT   A bacterial cough may be treated with antibiotic medicine.  A viral cough must run its course and will not respond to antibiotics.  Your caregiver may recommend other treatments if you have a chronic cough. HOME CARE INSTRUCTIONS   Only take over-the-counter or prescription medicines for pain, discomfort, or fever as directed by your caregiver. Use cough suppressants only as directed by your caregiver.  Use a cold steam vaporizer or humidifier in your bedroom or home to help loosen secretions.  Sleep in a semi-upright position if your cough is worse at  night.  Rest as needed.  Stop smoking if you smoke. SEEK IMMEDIATE MEDICAL CARE IF:   You have pus in your sputum.  Your cough starts to worsen.  You cannot control your cough with suppressants and are losing sleep.  You begin coughing up blood.  You have difficulty breathing.  You develop pain which is getting worse or is uncontrolled with medicine.  You have a fever. MAKE SURE YOU:   Understand these instructions.  Will watch your condition.  Will get help right away if you are not doing well or get worse. Document Released: 10/17/2010 Document Revised: 07/13/2011 Document Reviewed: 10/17/2010 [facility] Patient Information 2015 [place], [place]. This information is not intended to replace advice given to you by your health care provider. Make sure you discuss any questions you have with your health care provider.  Chest Wall Pain Chest wall pain is pain in or around the bones and muscles of your chest. It may take up to 6 weeks to get better. It may take longer if you must stay physically active in your work and activities.  CAUSES  Chest wall pain may happen on its own. However, it may be caused by:  A viral illness like the flu.  Injury.  Coughing.  Exercise.  Arthritis.  Fibromyalgia.  Shingles. HOME CARE INSTRUCTIONS   Avoid overtiring physical activity. Try not to strain or perform activities that cause pain. This includes any activities using your chest or your abdominal and side muscles, especially if heavy weights are used.  Put ice on the sore area.  Put ice in a plastic bag.  Place a towel between your skin and the bag.  Leave the ice on for 15-20 minutes per hour while awake for the first 2 days.  Only take over-the-counter or prescription medicines for pain, discomfort, or fever as directed by your caregiver. SEEK IMMEDIATE MEDICAL CARE IF:   Your pain increases, or you are very uncomfortable.  You have a fever.  Your chest pain becomes  worse.  You have new, unexplained symptoms.  You have nausea or vomiting.  You feel sweaty or lightheaded.  You have a cough with phlegm (sputum), or you cough up blood. MAKE SURE YOU:   Understand these instructions.  Will watch your condition.  Will get help right away if you are not doing well or get worse. Document Released: 04/20/2005 Document Revised: 07/13/2011 Document Reviewed: 12/15/2010 [facility] Patient Information 2015 [place], [place]. This information is not intended to replace advice given to you by your health care provider. Make sure you discuss any questions you have with your health care provider.     I personally performed the services described in this documentation, which was scribed in my presence. The recorded information has been reviewed and considered, and addended by me as needed.

## 2014-02-25 NOTE — Patient Instructions (Signed)
Restart your blood pressure medicine. Keep a record of your blood pressures outside of the office and if persisting over 140/90 - recheck to discuss your medicine. Headache is likely due to cough, and elevated blood pressure, but if this is not improving with treating these two conditions - can recheck here or emergency room if needed if any worsening.  Do not exceed 800mg  ibuprofen every 8 hours - only use this as needed as it can also elevate your blood pressure.  Tessalon or mucinex DM during the day for cough, and cough syrup at night if needed.  You can use the cough syrup during the day if needed, but it can make you drowsy.   If cough not improving into next week, can start antibiotic for early bronchitis, but your infection appears to be due to a virus at this time.  Return to the clinic or go to the nearest emergency room if any of your symptoms worsen or new symptoms occur.  Cough, Adult  A cough is a reflex that helps clear your throat and airways. It can help heal the body or may be a reaction to an irritated airway. A cough may only last 2 or 3 weeks (acute) or may last more than 8 weeks (chronic).  CAUSES Acute cough:  Viral or bacterial infections. Chronic cough:  Infections.  Allergies.  Asthma.  Post-nasal drip.  Smoking.  Heartburn or acid reflux.  Some medicines.  Chronic lung problems (COPD).  Cancer. SYMPTOMS   Cough.  Fever.  Chest pain.  Increased breathing rate.  High-pitched whistling sound when breathing (wheezing).  Colored mucus that you cough up (sputum). TREATMENT   A bacterial cough may be treated with antibiotic medicine.  A viral cough must run its course and will not respond to antibiotics.  Your caregiver may recommend other treatments if you have a chronic cough. HOME CARE INSTRUCTIONS   Only take over-the-counter or prescription medicines for pain, discomfort, or fever as directed by your caregiver. Use cough suppressants only  as directed by your caregiver.  Use a cold steam vaporizer or humidifier in your bedroom or home to help loosen secretions.  Sleep in a semi-upright position if your cough is worse at night.  Rest as needed.  Stop smoking if you smoke. SEEK IMMEDIATE MEDICAL CARE IF:   You have pus in your sputum.  Your cough starts to worsen.  You cannot control your cough with suppressants and are losing sleep.  You begin coughing up blood.  You have difficulty breathing.  You develop pain which is getting worse or is uncontrolled with medicine.  You have a fever. MAKE SURE YOU:   Understand these instructions.  Will watch your condition.  Will get help right away if you are not doing well or get worse. Document Released: 10/17/2010 Document Revised: 07/13/2011 Document Reviewed: 10/17/2010 [facility] Patient Information 2015 [place], [place]. This information is not intended to replace advice given to you by your health care provider. Make sure you discuss any questions you have with your health care provider.  Chest Wall Pain Chest wall pain is pain in or around the bones and muscles of your chest. It may take up to 6 weeks to get better. It may take longer if you must stay physically active in your work and activities.  CAUSES  Chest wall pain may happen on its own. However, it may be caused by:  A viral illness like the flu.  Injury.  Coughing.  Exercise.  Arthritis.  Fibromyalgia.  Shingles. HOME CARE INSTRUCTIONS   Avoid overtiring physical activity. Try not to strain or perform activities that cause pain. This includes any activities using your chest or your abdominal and side muscles, especially if heavy weights are used.  Put ice on the sore area.  Put ice in a plastic bag.  Place a towel between your skin and the bag.  Leave the ice on for 15-20 minutes per hour while awake for the first 2 days.  Only take over-the-counter or prescription medicines for pain,  discomfort, or fever as directed by your caregiver. SEEK IMMEDIATE MEDICAL CARE IF:   Your pain increases, or you are very uncomfortable.  You have a fever.  Your chest pain becomes worse.  You have new, unexplained symptoms.  You have nausea or vomiting.  You feel sweaty or lightheaded.  You have a cough with phlegm (sputum), or you cough up blood. MAKE SURE YOU:   Understand these instructions.  Will watch your condition.  Will get help right away if you are not doing well or get worse. Document Released: 04/20/2005 Document Revised: 07/13/2011 Document Reviewed: 12/15/2010 [facility] Patient Information 2015 [place], [place]. This information is not intended to replace advice given to you by your health care provider. Make sure you discuss any questions you have with your health care provider.

## 2014-04-22 ENCOUNTER — Ambulatory Visit (INDEPENDENT_AMBULATORY_CARE_PROVIDER_SITE_OTHER): Payer: Federal, State, Local not specified - PPO

## 2014-04-22 ENCOUNTER — Other Ambulatory Visit: Payer: Self-pay | Admitting: Family Medicine

## 2014-04-22 ENCOUNTER — Ambulatory Visit (INDEPENDENT_AMBULATORY_CARE_PROVIDER_SITE_OTHER): Payer: Federal, State, Local not specified - PPO | Admitting: Family Medicine

## 2014-04-22 VITALS — BP 158/82 | HR 97 | Temp 98.6°F | Resp 18

## 2014-04-22 DIAGNOSIS — E669 Obesity, unspecified: Secondary | ICD-10-CM

## 2014-04-22 DIAGNOSIS — R059 Cough, unspecified: Secondary | ICD-10-CM

## 2014-04-22 DIAGNOSIS — J209 Acute bronchitis, unspecified: Secondary | ICD-10-CM

## 2014-04-22 DIAGNOSIS — R05 Cough: Secondary | ICD-10-CM

## 2014-04-22 LAB — POCT CBC
Granulocyte percent: 41.9 %G (ref 37–80)
HCT, POC: 39.6 % (ref 37.7–47.9)
Hemoglobin: 13 g/dL (ref 12.2–16.2)
Lymph, poc: 2.2 (ref 0.6–3.4)
MCH, POC: 27.4 pg (ref 27–31.2)
MCHC: 32.7 g/dL (ref 31.8–35.4)
MCV: 83.8 fL (ref 80–97)
MID (cbc): 0.5 (ref 0–0.9)
MPV: 7.5 fL (ref 0–99.8)
POC Granulocyte: 2 (ref 2–6.9)
POC LYMPH PERCENT: 46.7 %L (ref 10–50)
POC MID %: 11.4 %M (ref 0–12)
Platelet Count, POC: 294 10*3/uL (ref 142–424)
RBC: 4.73 M/uL (ref 4.04–5.48)
RDW, POC: 13.7 %
WBC: 4.7 10*3/uL (ref 4.6–10.2)

## 2014-04-22 LAB — GLUCOSE, POCT (MANUAL RESULT ENTRY): POC Glucose: 93 mg/dl (ref 70–99)

## 2014-04-22 MED ORDER — IPRATROPIUM BROMIDE 0.02 % IN SOLN
0.5000 mg | Freq: Once | RESPIRATORY_TRACT | Status: AC
  Administered 2014-04-22: 0.5 mg via RESPIRATORY_TRACT

## 2014-04-22 MED ORDER — AZITHROMYCIN 250 MG PO TABS: ORAL_TABLET | ORAL | Status: DC

## 2014-04-22 MED ORDER — HYDROCOD POLST-CHLORPHEN POLST 10-8 MG/5ML PO LQCR: 5.0000 mL | Freq: Two times a day (BID) | ORAL | Status: DC | PRN

## 2014-04-22 MED ORDER — PREDNISONE 20 MG PO TABS: 40.0000 mg | ORAL_TABLET | Freq: Every day | ORAL | Status: DC

## 2014-04-22 MED ORDER — ALBUTEROL SULFATE (2.5 MG/3ML) 0.083% IN NEBU
2.5000 mg | INHALATION_SOLUTION | Freq: Once | RESPIRATORY_TRACT | Status: AC
  Administered 2014-04-22: 2.5 mg via RESPIRATORY_TRACT

## 2014-04-22 NOTE — Patient Instructions (Signed)

## 2014-04-22 NOTE — Progress Notes (Signed)
Patient ID: Danielle PayorBarbara K Finamore MRN: [id number], DOB: [birth year], 58 y.o. Date of Encounter: 04/22/2014, 10:02 AM  Primary Physician: No primary care provider on file.  Chief Complaint:  Chief Complaint  Patient presents with  . Cough  . Shortness of Breath    HPI: 58 y.o. year old female presents with a 14 day history of nasal congestion, post nasal drip, sore throat, and cough. Mild sinus pressure. Afebrile. No chills. Nasal congestion thick and green/yellow. Cough is productive of green/yellow sputum and not associated with time of day. Ears feel full, leading to sensation of muffled hearing. Has tried OTC cold preps without success. No GI complaints.   No sick contacts, recent antibiotics, or recent travels.   No leg trauma, sedentary periods, h/o cancer, or tobacco use.  Past Medical History  Diagnosis Date  . Anxiety   . Depression   . GERD (gastroesophageal reflux disease)   . Hypertension      Home Meds: Prior to Admission medications   Medication Sig Start Date End Date Taking? Authorizing Provider  amLODipine (NORVASC) 10 MG tablet TAKE 1 TABLET (10 MG TOTAL) BY MOUTH DAILY. 12/27/11  Yes Chelle S Jeffery, PA-C  amLODipine (NORVASC) 10 MG tablet TAKE 1 TABLET (10 MG TOTAL) BY MOUTH DAILY. 04/09/12  Yes Ryan M Dunn, PA-C  hydrochlorothiazide (HYDRODIURIL) 25 MG tablet TAKE 1 TABLET (25 MG TOTAL) BY MOUTH DAILY. 11/28/11  Yes Marzella SchleinAngela M McClung, PA-C  citalopram (CELEXA) 10 MG tablet Take 1 tablet (10 mg total) by mouth daily. NEEDS OFFICE VISIT FOR MORE 07/22/11 07/21/12  Morrell RiddleSarah L Weber, PA-C  citalopram (CELEXA) 20 MG tablet Take 20 mg by mouth daily.    Historical Provider, MD  clonazePAM (KLONOPIN) 0.5 MG tablet Take 0.5 mg by mouth 2 (two) times daily as needed for anxiety.    Historical Provider, MD  doxycycline (VIBRA-TABS) 100 MG tablet Take 1 tablet (100 mg total) by mouth 2 (two) times daily. Patient not taking: Reported on 04/22/2014 02/25/14   Shade FloodJeffrey R Greene, MD    HYDROcodone-homatropine [facility](HYCODAN) 5-1.5 MG/5ML syrup Take 5 mLs by mouth every 6 (six) hours as needed. Patient not taking: Reported on 04/22/2014 02/25/14   Shade FloodJeffrey R Greene, MD    Allergies:  Allergies  Allergen Reactions  . Lisinopril Swelling    History   Social History  . Marital Status: Married    Spouse Name: N/A    Number of Children: N/A  . Years of Education: N/A   Occupational History  . Not on file.   Social History Main Topics  . Smoking status: Never Smoker   . Smokeless tobacco: Never Used  . Alcohol Use: Yes     Comment: wine/beer 2-3 x's/week  . Drug Use: No  . Sexual Activity: Not on file   Other Topics Concern  . Not on file   Social History Narrative     Review of Systems: Constitutional: positive for chills, night sweats; negative for weight changes Cardiovascular: negative for chest pain or palpitations Respiratory: negative for hemoptysis, wheezing, or shortness of breath Abdominal: negative for abdominal pain, nausea, vomiting or diarrhea Dermatological: negative for rash Neurologic: negative for headache   Physical Exam:  Patient has continual violent cough to the point of retching. Blood pressure 158/82, pulse 97, temperature 98.6 F (37 C), temperature source Oral, resp. rate 18., There is no weight on file to calculate BMI. General: Well developed, well nourished, in no acute distress. Head: Normocephalic, atraumatic, eyes without discharge,  sclera non-icteric, nares are congested. Bilateral auditory canals clear, TM's are without perforation, pearly grey with reflective cone of light bilaterally. No sinus TTP. Oral cavity moist, dentition normal. Posterior pharynx with post nasal drip and mild erythema. No peritonsillar abscess or tonsillar exudate. Neck: Supple. No thyromegaly. Full ROM. No lymphadenopathy. Lungs: Coarse breath sounds bilaterally without wheezes, rales, or rhonchi. Breathing is unlabored.  Heart: RRR with S1 S2. No  murmurs, rubs, or gallops appreciated. Msk:  Strength and tone normal for age. Extremities: No clubbing or cyanosis. No edema. Neuro: Alert and oriented X 3. Moves all extremities spontaneously. CNII-XII grossly in tact. Psych:  Responds to questions appropriately with a normal affect.   Labs: Results for orders placed or performed in visit on 04/22/14  POCT CBC  Result Value Ref Range   WBC 4.7 4.6 - 10.2 K/uL   Lymph, poc 2.2 0.6 - 3.4   POC LYMPH PERCENT 46.7 10 - 50 %L   MID (cbc) 0.5 0 - 0.9   POC MID % 11.4 0 - 12 %M   POC Granulocyte 2.0 2 - 6.9   Granulocyte percent 41.9 37 - 80 %G   RBC 4.73 4.04 - 5.48 M/uL   Hemoglobin 13.0 12.2 - 16.2 g/dL   HCT, POC 10.239.6 72.537.7 - 47.9 %   MCV 83.8 80 - 97 fL   MCH, POC 27.4 27 - 31.2 pg   MCHC 32.7 31.8 - 35.4 g/dL   RDW, POC 36.613.7 %   Platelet Count, POC 294 142 - 424 K/uL   MPV 7.5 0 - 99.8 fL  POCT glucose (manual entry)  Result Value Ref Range   POC Glucose 93 70 - 99 mg/dl     [facility] reading (PRIMARY) by  Dr. Milus GlazierLauenstein:  Chest x-ray shows no definite infiltrate.  Patient given nebulizer treatment ASSESSMENT AND PLAN:  58 y.o. year old female with bronchitis. -   ICD-9-CM ICD-10-CM   1. Cough 786.2 R05 POCT CBC     POCT glucose (manual entry)     DG Chest 2 View     albuterol (PROVENTIL) (2.5 MG/3ML) 0.083% nebulizer solution 2.5 mg     ipratropium (ATROVENT) nebulizer solution 0.5 mg     Bordetella pertussis antibody     CANCELED: B pertussis IgG/IgM Ab   -Tylenol/Motrin prn -Rest/fluids -RTC precautions -RTC 3-5 days if no improvement  Signed, Elvina SidleKurt Alvaretta Eisenberger, MD 04/22/2014 10:02 AM

## 2014-04-25 LAB — BORDETELLA PERTUSSIS AB IGG,IGA
FHA IgA: 13 IU/mL
FHA IgG: 13 IU/mL
PT IgA: 2 IU/mL
PT IgG: <1 IU/mL

## 2014-05-01 ENCOUNTER — Telehealth: Payer: Self-pay | Admitting: Family Medicine

## 2014-05-01 NOTE — Telephone Encounter (Signed)
Called patient gave her results of labs

## 2014-05-01 NOTE — Telephone Encounter (Signed)
Did you order the pertussis test i dont see the order

## 2014-05-01 NOTE — Telephone Encounter (Signed)
-----   Message from Kurt Lauenstein, MD sent at 04/28/2014  8:17 AM EST ----- Labs normal.  Cannot find result of pertussis ----- Message -----    From: SYSTEM    Sent: 04/27/2014  12:04 AM      To: Kurt Lauenstein, MD    

## 2014-05-01 NOTE — Telephone Encounter (Signed)
-----   Message from Elvina SidleKurt Lauenstein, MD sent at 04/28/2014  8:17 AM EST ----- Labs normal.  Cannot find result of pertussis ----- Message -----    From: SYSTEM    Sent: 04/27/2014  12:04 AM      To: Elvina SidleKurt Lauenstein, MD

## 2014-10-22 ENCOUNTER — Ambulatory Visit
Admission: RE | Admit: 2014-10-22 | Discharge: 2014-10-22 | Disposition: A | Discharge status: Home / Self Care | Payer: Federal, State, Local not specified - PPO | Source: Ambulatory Visit | Attending: Family Medicine | Admitting: Family Medicine

## 2014-10-22 ENCOUNTER — Other Ambulatory Visit: Payer: Self-pay | Admitting: Family Medicine

## 2014-10-22 DIAGNOSIS — M5489 Other dorsalgia: Secondary | ICD-10-CM

## 2014-12-25 ENCOUNTER — Ambulatory Visit (INDEPENDENT_AMBULATORY_CARE_PROVIDER_SITE_OTHER): Payer: Federal, State, Local not specified - PPO | Admitting: Physician Assistant

## 2014-12-25 VITALS — BP 140/78 | HR 77 | Temp 98.4°F | Resp 16 | Ht 64.0 in | Wt 199.0 lb

## 2014-12-25 DIAGNOSIS — Z418 Encounter for other procedures for purposes other than remedying health state: Secondary | ICD-10-CM | POA: Diagnosis not present

## 2014-12-25 DIAGNOSIS — R6884 Jaw pain: Secondary | ICD-10-CM

## 2014-12-25 DIAGNOSIS — Z299 Encounter for prophylactic measures, unspecified: Secondary | ICD-10-CM

## 2014-12-25 LAB — POCT SEDIMENTATION RATE: POCT SED RATE: 36 mm/hr — AB (ref 0–22)

## 2014-12-25 LAB — POCT CBC
Granulocyte percent: 57.4 %G (ref 37–80)
HCT, POC: 41.8 % (ref 37.7–47.9)
Hemoglobin: 13.2 g/dL (ref 12.2–16.2)
LYMPH, POC: 2 (ref 0.6–3.4)
MCH, POC: 25.6 pg — AB (ref 27–31.2)
MCHC: 31.5 g/dL — AB (ref 31.8–35.4)
MCV: 81.3 fL (ref 80–97)
MID (CBC): 0.4 (ref 0–0.9)
MPV: 6.7 fL (ref 0–99.8)
POC Granulocyte: 3.2 (ref 2–6.9)
POC LYMPH PERCENT: 35.8 %L (ref 10–50)
POC MID %: 6.8 % (ref 0–12)
Platelet Count, POC: 384 10*3/uL (ref 142–424)
RBC: 5.14 M/uL (ref 4.04–5.48)
RDW, POC: 14.2 %
WBC: 5.5 10*3/uL (ref 4.6–10.2)

## 2014-12-25 LAB — COMPLETE METABOLIC PANEL WITH GFR
ALT: 14 U/L (ref 6–29)
AST: 15 U/L (ref 10–35)
Albumin: 4.6 g/dL (ref 3.6–5.1)
Alkaline Phosphatase: 85 U/L (ref 33–130)
BUN: 17 mg/dL (ref 7–25)
CO2: 28 mmol/L (ref 20–31)
CREATININE: 0.66 mg/dL (ref 0.50–0.99)
Calcium: 10.2 mg/dL (ref 8.6–10.4)
Chloride: 99 mmol/L (ref 98–110)
GFR, Est Non African American: >89 mL/min (ref >=60)
Glucose, Bld: 93 mg/dL (ref 65–99)
Potassium: 4 mmol/L (ref 3.5–5.3)
Sodium: 142 mmol/L (ref 135–146)
TOTAL PROTEIN: 8 g/dL (ref 6.1–8.1)
Total Bilirubin: 0.4 mg/dL (ref 0.2–1.2)

## 2014-12-25 MED ORDER — RANITIDINE HCL 150 MG PO TABS: 150.0000 mg | ORAL_TABLET | Freq: Two times a day (BID) | ORAL | Status: DC

## 2014-12-25 MED ORDER — NAPROXEN 500 MG PO TABS: 500.0000 mg | ORAL_TABLET | Freq: Two times a day (BID) | ORAL | Status: DC

## 2014-12-25 NOTE — Progress Notes (Signed)
12/25/2014 at 12:57 PM  Danielle Anthony / DOB: 02/12/1956 / MRN: [id number]  The patient has HTN (hypertension) and Obesity on her problem list.  SUBJECTIVE  Danielle Anthony is a 59 y.o. well appearing female presenting for the chief complaint of sharp right sided facial pain that comes and goes and this started last night. Her pain is made worse with opening and closing her jaw, and she complains that her right ear and throat are bothering her as well, also made worse with opening and closing her jaw.   She has a history of HTN controlled on Norvasc and HCTZ and she never misses doses.   She reports very little alcohol consumption. She has a history of GERD, but does not take medication daily for this.   She  has a past medical history of Anxiety; Depression; GERD (gastroesophageal reflux disease); and Hypertension.    Medications reviewed and updated by myself where necessary, and exist elsewhere in the encounter.   Danielle Anthony is allergic to lisinopril. She  reports that she has never smoked. She has never used smokeless tobacco. She reports that she drinks alcohol. She reports that she does not use illicit drugs. She  has no sexual activity history on file. The patient  has past surgical history that includes Breast surgery; Colon surgery; and Abdominal hysterectomy.  Her family history includes Heart disease in her brother.  Review of Systems  Constitutional: Negative for fever and chills.  HENT: Positive for ear pain. Negative for congestion and sore throat.   Respiratory: Negative for shortness of breath.   Cardiovascular: Negative for chest pain.  Gastrointestinal: Negative for nausea and abdominal pain.  Genitourinary: Negative.   Skin: Negative for rash.  Neurological: Negative for dizziness, tingling, tremors, sensory change, speech change, focal weakness and headaches.    OBJECTIVE  Her  height is 5\' 4"  (1.626 m) and weight is 199 lb (90.266 kg). Her oral temperature is 98.4  F (36.9 C). Her blood pressure is 140/78 and her pulse is 77. Her respiration is 16 and oxygen saturation is 98%.  The patient's body mass index is 34.14 kg/(m^2).  Physical Exam  Constitutional: She is oriented to person, place, and time. She appears well-developed and well-nourished. No distress.  HENT:  Head:    Right Ear: Hearing and tympanic membrane normal.  Left Ear: Hearing and tympanic membrane normal.  Nose: Mucosal edema present. Right sinus exhibits no maxillary sinus tenderness and no frontal sinus tenderness. Left sinus exhibits no maxillary sinus tenderness and no frontal sinus tenderness.  Mouth/Throat: Uvula is midline, oropharynx is clear and moist and mucous membranes are normal.  Eyes: Pupils are equal, round, and reactive to light.  Cardiovascular: Normal rate and regular rhythm.   Respiratory: Effort normal. No respiratory distress.  GI: She exhibits no distension.  Musculoskeletal: Normal range of motion.  Neurological: She is alert and oriented to person, place, and time. No cranial nerve deficit. Coordination normal.  Skin: Skin is warm and dry. No rash noted. She is not diaphoretic.  Psychiatric: She has a normal mood and affect. Her behavior is normal. Judgment and thought content normal.    Results for orders placed or performed in visit on 12/25/14 (from the past 24 hour(s))  POCT CBC     Status: Abnormal   Collection Time: 12/25/14 12:36 PM  Result Value Ref Range   WBC 5.5 4.6 - 10.2 K/uL   Lymph, poc 2.0 0.6 - 3.4   POC LYMPH  PERCENT 35.8 10 - 50 %L   MID (cbc) 0.4 0 - 0.9   POC MID % 6.8 0 - 12 %M   POC Granulocyte 3.2 2 - 6.9   Granulocyte percent 57.4 37 - 80 %G   RBC 5.14 4.04 - 5.48 M/uL   Hemoglobin 13.2 12.2 - 16.2 g/dL   HCT, POC 16.1 09.6 - 47.9 %   MCV 81.3 80 - 97 fL   MCH, POC 25.6 (A) 27 - 31.2 pg   MCHC 31.5 (A) 31.8 - 35.4 g/dL   RDW, POC 04.5 %   Platelet Count, POC 384 142 - 424 K/uL   MPV 6.7 0 - 99.8 fL    ASSESSMENT &  PLAN  Danielle Anthony was seen today for cough, sinusitis, sore throat and ear pain.  Diagnoses and all orders for this visit:  Jaw pain: Most likely TMJ given clicking and pain with jaw movement.  She has no temporal tenderness and her throat appears normal.  I see no rash what so ever and her cranial nerves are intact.  Will treat symptomatically for now.  Sed rate pending and if elevated will possibly add on antibiotic to cover for parotitis. -     POCT CBC -     COMPLETE METABOLIC PANEL WITH GFR -     naproxen (NAPROSYN) 500 MG tablet; Take 1 tablet (500 mg total) by mouth 2 (two) times daily with a meal. -     POCT SEDIMENTATION RATE  Need for prophylactic measure: Patient with occasional GERD symptoms and will cover given the need for pain control with an NSAID.  -     ranitidine (ZANTAC) 150 MG tablet; Take 1 tablet (150 mg total) by mouth 2 (two) times daily.    The patient was advised to call or come back to clinic if she does not see an improvement in symptoms, or worsens with the above plan.   Danielle Anthony, MHS, PA-C [facility] 12/25/2014 12:57 PM

## 2015-03-11 ENCOUNTER — Ambulatory Visit (INDEPENDENT_AMBULATORY_CARE_PROVIDER_SITE_OTHER): Payer: Federal, State, Local not specified - PPO | Admitting: Physician Assistant

## 2015-03-11 VITALS — BP 140/70 | HR 85 | Temp 98.6°F | Resp 18 | Ht 64.0 in | Wt 206.0 lb

## 2015-03-11 DIAGNOSIS — R05 Cough: Secondary | ICD-10-CM

## 2015-03-11 DIAGNOSIS — R059 Cough, unspecified: Secondary | ICD-10-CM

## 2015-03-11 MED ORDER — RANITIDINE HCL 150 MG PO TABS: 150.0000 mg | ORAL_TABLET | Freq: Every day | ORAL | Status: DC

## 2015-03-11 MED ORDER — CETIRIZINE HCL 10 MG PO TABS: 10.0000 mg | ORAL_TABLET | Freq: Every day | ORAL | Status: DC

## 2015-03-11 MED ORDER — HYDROCODONE-HOMATROPINE 5-1.5 MG/5ML PO SYRP: 2.5000 mL | ORAL_SOLUTION | Freq: Every evening | ORAL | Status: DC | PRN

## 2015-03-11 NOTE — Progress Notes (Signed)
03/11/2015 at 1:18 PM  Danielle Anthony / DOB: 01-Dec-1955 / MRN: [id number]  The patient has HTN (hypertension) and Obesity on her problem list.  SUBJECTIVE  Danielle Anthony is a 59 y.o. female who complains of non bloody productive cough that started after a sore throat and rhinorrhea, which started 12 days ago. Reports that she would no be here today unless her husband told her to come, as her cough is disrupting his sleep.  Denies fever, chills, SOB, pleuritic pain and DOE.  Denies leg and abdominal swelling.  States that she has a history of GERD and this has not been bothering her lately, but has taken Zantac in the past with excellent relief.  Denies a history of asthma and smoking.   She  has a past medical history of Anxiety; Depression; GERD (gastroesophageal reflux disease); and Hypertension.    Medications reviewed and updated by myself where necessary, and exist elsewhere in the encounter.   Danielle Anthony is allergic to lisinopril. She  reports that she has never smoked. She has never used smokeless tobacco. She reports that she drinks alcohol. She reports that she does not use illicit drugs. She  has no sexual activity history on file. The patient  has past surgical history that includes Breast surgery; Colon surgery; and Abdominal hysterectomy.  Her family history includes Heart disease in her brother.  Review of Systems  Constitutional: Negative for fever and chills.  Respiratory: Positive for cough and sputum production. Negative for hemoptysis, shortness of breath and wheezing.   Cardiovascular: Negative for chest pain.  Gastrointestinal: Negative for nausea and abdominal pain.  Genitourinary: Negative.   Skin: Negative for rash.  Neurological: Negative for dizziness and headaches.    OBJECTIVE  Her  height is  (1.626 m) and weight is 206 lb (93.441 kg). Her oral temperature is 98.6 F (37 C). Her blood pressure is 140/70 and her pulse is 85. Her respiration is 18 and  oxygen saturation is 99%.  The patient's body mass index is 35.34 kg/(m^2).  Physical Exam  Constitutional: She is oriented to person, place, and time. She appears well-developed and well-nourished. No distress.  HENT:  Right Ear: External ear normal.  Left Ear: External ear normal.  Nose: Nose normal.  Mouth/Throat: No oropharyngeal exudate.  Eyes: Conjunctivae and EOM are normal. Pupils are equal, round, and reactive to light.  Neck: Normal range of motion. No thyromegaly present.  Cardiovascular: Normal rate, regular rhythm, normal heart sounds and intact distal pulses.   Respiratory: Effort normal and breath sounds normal.  GI: Soft. Bowel sounds are normal.  Musculoskeletal: Normal range of motion.  Lymphadenopathy:    She has no cervical adenopathy.  Neurological: She is alert and oriented to person, place, and time. She has normal reflexes. No cranial nerve deficit. Coordination normal.  Skin: Skin is warm and dry. She is not diaphoretic.  Psychiatric: She has a normal mood and affect. Her behavior is normal. Judgment and thought content normal.    No results found for this or any previous visit (from the past 24 hour(s)).  ASSESSMENT & PLAN  Danielle Anthony was seen today for cough and sore throat.  Diagnoses and all orders for this visit:  Cough: Vitals reassuring. Patient with likely post viral cough syndrome, allergies, or LPR.  Will treat symptomatically for now and give this more time to resolve. She is to call or return in 10 days if her symptoms are not improved.   -  HYDROcodone-homatropine (HYCODAN) 5-1.5 MG/5ML syrup; Take 2.5 mLs by mouth at bedtime as needed for cough (Do not operate heavy machinery while taking this medication.). -     cetirizine (ZYRTEC) 10 MG tablet; Take 1 tablet (10 mg total) by mouth daily. -     ranitidine (ZANTAC) 150 MG tablet; Take 1 tablet (150 mg total) by mouth at bedtime. Take for the next 10 days whether you are symptomatic or  not.   The patient was advised to call or come back to clinic if she does not see an improvement in symptoms, or worsens with the above plan.   Deliah BostonMichael Haylen Bellotti, MHS, PA-C [facility] 03/11/2015 1:18 PM

## 2015-05-27 ENCOUNTER — Ambulatory Visit (INDEPENDENT_AMBULATORY_CARE_PROVIDER_SITE_OTHER): Payer: Federal, State, Local not specified - PPO | Admitting: Family Medicine

## 2015-05-27 ENCOUNTER — Ambulatory Visit (INDEPENDENT_AMBULATORY_CARE_PROVIDER_SITE_OTHER): Payer: Federal, State, Local not specified - PPO

## 2015-05-27 VITALS — BP 154/78 | HR 86 | Temp 99.0°F | Ht 64.5 in | Wt 205.0 lb

## 2015-05-27 DIAGNOSIS — R062 Wheezing: Secondary | ICD-10-CM

## 2015-05-27 DIAGNOSIS — J209 Acute bronchitis, unspecified: Secondary | ICD-10-CM | POA: Diagnosis not present

## 2015-05-27 DIAGNOSIS — R059 Cough, unspecified: Secondary | ICD-10-CM

## 2015-05-27 DIAGNOSIS — R05 Cough: Secondary | ICD-10-CM | POA: Diagnosis not present

## 2015-05-27 DIAGNOSIS — J0100 Acute maxillary sinusitis, unspecified: Secondary | ICD-10-CM

## 2015-05-27 DIAGNOSIS — R0602 Shortness of breath: Secondary | ICD-10-CM

## 2015-05-27 MED ORDER — AMOXICILLIN-POT CLAVULANATE 875-125 MG PO TABS: 1.0000 | ORAL_TABLET | Freq: Two times a day (BID) | ORAL | Status: DC

## 2015-05-27 MED ORDER — IPRATROPIUM BROMIDE 0.02 % IN SOLN
0.5000 mg | Freq: Once | RESPIRATORY_TRACT | Status: AC
  Administered 2015-05-27: 0.5 mg via RESPIRATORY_TRACT

## 2015-05-27 MED ORDER — HYDROCOD POLST-CPM POLST ER 10-8 MG/5ML PO SUER: 5.0000 mL | Freq: Two times a day (BID) | ORAL | Status: DC | PRN

## 2015-05-27 MED ORDER — ALBUTEROL SULFATE (2.5 MG/3ML) 0.083% IN NEBU
2.5000 mg | INHALATION_SOLUTION | Freq: Once | RESPIRATORY_TRACT | Status: AC
Start: 2015-05-27 — End: 2015-05-27
  Administered 2015-05-27: 2.5 mg via RESPIRATORY_TRACT

## 2015-05-27 MED ORDER — PREDNISONE 20 MG PO TABS: ORAL_TABLET | ORAL | Status: DC

## 2015-05-27 MED ORDER — ALBUTEROL SULFATE HFA 108 (90 BASE) MCG/ACT IN AERS
2.0000 | INHALATION_SPRAY | Freq: Four times a day (QID) | RESPIRATORY_TRACT | Status: DC | PRN
Start: 2015-05-27 — End: 2022-11-12

## 2015-05-27 NOTE — Patient Instructions (Signed)

## 2015-05-27 NOTE — Progress Notes (Signed)
Chief Complaint:  Chief Complaint  Patient presents with  . Cough    x 5 days  . Shortness of Breath  . Headache    L/sided  . Chest Pain    L/sided, under scapula-radiates around to front breast area    HPI: Danielle Anthony is a 60 y.o. female who reports to [facility] today complaining of cough x 5 days and feeling poorly, she has had SOB, HAs  and also wheezing and left sided CP.She was hhaving cough prior and was seen her for this in Nov and then Dec by her PCP, had breathing treatments, steroids and also cough meds. She has had flu vaccine. She has sinus pressure on her left side. She has tried everything. She denies ahving asthma, has had allergiesbut seasonal and taken allergy meds, has GERD wbut well controlled.   Past Medical History  Diagnosis Date  . Anxiety   . Depression   . GERD (gastroesophageal reflux disease)   . Hypertension    Past Surgical History  Procedure Laterality Date  . Breast surgery    . Colon surgery    . Abdominal hysterectomy     Social History   Social History  . Marital Status: Married    Spouse Name: N/A  . Number of Children: N/A  . Years of Education: N/A   Social History Main Topics  . Smoking status: Never Smoker   . Smokeless tobacco: Never Used  . Alcohol Use: Yes     Comment: wine/beer 2-3 x's/week  . Drug Use: No  . Sexual Activity: Not Asked   Other Topics Concern  . None   Social History Narrative   Family History  Problem Relation Age of Onset  . Heart disease Brother    Allergies  Allergen Reactions  . Lisinopril Swelling   Prior to Admission medications   Medication Sig Start Date End Date Taking? Authorizing Provider  amLODipine (NORVASC) 10 MG tablet TAKE 1 TABLET (10 MG TOTAL) BY MOUTH DAILY. 12/27/11  Yes Chelle Jeffery, PA-C  citalopram (CELEXA) 20 MG tablet Take 20 mg by mouth daily.   Yes Historical Provider, MD  clonazePAM (KLONOPIN) 0.5 MG tablet Take 0.5 mg by mouth 2 (two) times daily as needed  for anxiety.   Yes Historical Provider, MD  hydrochlorothiazide (HYDRODIURIL) 25 MG tablet TAKE 1 TABLET (25 MG TOTAL) BY MOUTH DAILY. 11/28/11  Yes Marzella Schlein McClung, PA-C  ibuprofen (ADVIL,MOTRIN) 800 MG tablet Take 800 mg by mouth every 8 (eight) hours as needed.   Yes Historical Provider, MD  montelukast (SINGULAIR) 10 MG tablet Take 10 mg by mouth at bedtime.   Yes Historical Provider, MD  ranitidine (ZANTAC) 150 MG tablet Take 1 tablet (150 mg total) by mouth at bedtime. Take for the next 10 days whether you are symptomatic or not. 03/11/15  Yes Ofilia Neas, PA-C  cetirizine (ZYRTEC) 10 MG tablet Take 1 tablet (10 mg total) by mouth daily. Patient not taking: Reported on 05/27/2015 03/11/15   Ofilia Neas, PA-C  HYDROcodone-homatropine [facility]) 5-1.5 MG/5ML syrup Take 2.5 mLs by mouth at bedtime as needed for cough (Do not operate heavy machinery while taking this medication.). Patient not taking: Reported on 05/27/2015 03/11/15   Ofilia Neas, PA-C     ROS: The patient denies fevers, chills, night sweats, unintentional weight loss,palpitations,  dyspnea on exertion, nausea, vomiting, abdominal pain, dysuria, hematuria, melena, numbness, weakness, or tingling.  All other systems have been reviewed  and were otherwise negative with the exception of those mentioned in the HPI and as above.    PHYSICAL EXAM: Filed Vitals:   05/27/15 0852  BP: 154/78  Pulse: 86  Temp: 99 F (37.2 C)   SpO2 Readings from Last 3 Encounters:  05/27/15 98%  03/11/15 99%  12/25/14 98%    Body mass index is 34.66 kg/(m^2).   General: Alert, no acute distress HEENT:  Normocephalic, atraumatic, oropharynx patent. EOMI, PERRLA Erythematous throat, no exudates, TM normal, + sinus tenderness, + erythematous/boggy nasal mucosa Cardiovascular:  Regular rate and rhythm, no rubs murmurs or gallops.  No Carotid bruits, radial pulse intact. No pedal edema.  Respiratory: Clear to auscultation bilaterally.  No  wheezes, rales, or rhonchi.  No cyanosis, no use of accessory musculature Abdominal: No organomegaly, abdomen is soft and non-tender, positive bowel sounds. No masses. Skin: No rashes. Neurologic: Facial musculature symmetric. Psychiatric: Patient acts appropriately throughout our interaction. Lymphatic: No cervical or submandibular lymphadenopathy Musculoskeletal: Gait intact. No edema, tenderness   LABS: Results for orders placed or performed in visit on 12/25/14  COMPLETE METABOLIC PANEL WITH GFR  Result Value Ref Range   Sodium 142 135 - 146 mmol/L   Potassium 4.0 3.5 - 5.3 mmol/L   Chloride 99 98 - 110 mmol/L   CO2 28 20 - 31 mmol/L   Glucose, Bld 93 65 - 99 mg/dL   BUN 17 7 - 25 mg/dL   Creat 1.61 0.96 - 0.45 mg/dL   Total Bilirubin 0.4 0.2 - 1.2 mg/dL   Alkaline Phosphatase 85 33 - 130 U/L   AST 15 10 - 35 U/L   ALT 14 6 - 29 U/L   Total Protein 8.0 6.1 - 8.1 g/dL   Albumin 4.6 3.6 - 5.1 g/dL   Calcium 40.9 8.6 - 81.1 mg/dL   GFR, Est African American >89 >=60 mL/min   GFR, Est Non African American >89 >=60 mL/min  POCT CBC  Result Value Ref Range   WBC 5.5 4.6 - 10.2 K/uL   Lymph, poc 2.0 0.6 - 3.4   POC LYMPH PERCENT 35.8 10 - 50 %L   MID (cbc) 0.4 0 - 0.9   POC MID % 6.8 0 - 12 %M   POC Granulocyte 3.2 2 - 6.9   Granulocyte percent 57.4 37 - 80 %G   RBC 5.14 4.04 - 5.48 M/uL   Hemoglobin 13.2 12.2 - 16.2 g/dL   HCT, POC 91.4 78.2 - 47.9 %   MCV 81.3 80 - 97 fL   MCH, POC 25.6 (A) 27 - 31.2 pg   MCHC 31.5 (A) 31.8 - 35.4 g/dL   RDW, POC 95.6 %   Platelet Count, POC 384 142 - 424 K/uL   MPV 6.7 0 - 99.8 fL  POCT SEDIMENTATION RATE  Result Value Ref Range   POCT SED RATE 36 (A) 0 - 22 mm/hr     EKG/XRAY:   Primary read interpreted by Dr. Conley Rolls at [facility] ? Increase vascular markings vs right sided infiltrate   ASSESSMENT/PLAN: Encounter Diagnoses  Name Primary?  . Cough   . Wheezing   . SOB (shortness of breath)   . Acute maxillary sinusitis,  recurrence not specified   . Acute bronchitis, unspecified organism Yes   Rx Augmentin Rx prednisone taper, albuterol inhaler scheduled Rx Tussionex, she still has tessalon perles she can use but not effective, she does not want hycodan sicne did not help her the last time  Will fu  with chest xray  FU prn   Gross sideeffects, risk and benefits, and alternatives of medications d/w patient. Patient is aware that all medications have potential sideeffects and we are unable to predict every sideeffect or drug-drug interaction that may occur.  Jamaar Howes DO  05/27/2015 10:06 AM

## 2015-08-08 DIAGNOSIS — Z961 Presence of intraocular lens: Secondary | ICD-10-CM | POA: Diagnosis not present

## 2015-08-08 DIAGNOSIS — H2513 Age-related nuclear cataract, bilateral: Secondary | ICD-10-CM | POA: Diagnosis not present

## 2015-08-22 DIAGNOSIS — H2513 Age-related nuclear cataract, bilateral: Secondary | ICD-10-CM | POA: Diagnosis not present

## 2015-08-22 DIAGNOSIS — H2512 Age-related nuclear cataract, left eye: Secondary | ICD-10-CM | POA: Diagnosis not present

## 2015-08-22 DIAGNOSIS — Z961 Presence of intraocular lens: Secondary | ICD-10-CM | POA: Diagnosis not present

## 2015-09-17 DIAGNOSIS — H578 Other specified disorders of eye and adnexa: Secondary | ICD-10-CM | POA: Diagnosis not present

## 2015-10-18 DIAGNOSIS — M179 Osteoarthritis of knee, unspecified: Secondary | ICD-10-CM | POA: Diagnosis not present

## 2015-10-18 DIAGNOSIS — F419 Anxiety disorder, unspecified: Secondary | ICD-10-CM | POA: Diagnosis not present

## 2015-10-18 DIAGNOSIS — E7800 Pure hypercholesterolemia, unspecified: Secondary | ICD-10-CM | POA: Diagnosis not present

## 2015-10-18 DIAGNOSIS — I10 Essential (primary) hypertension: Secondary | ICD-10-CM | POA: Diagnosis not present

## 2015-10-18 DIAGNOSIS — E669 Obesity, unspecified: Secondary | ICD-10-CM | POA: Diagnosis not present

## 2016-04-20 DIAGNOSIS — M179 Osteoarthritis of knee, unspecified: Secondary | ICD-10-CM | POA: Diagnosis not present

## 2016-04-20 DIAGNOSIS — I10 Essential (primary) hypertension: Secondary | ICD-10-CM | POA: Diagnosis not present

## 2016-04-20 DIAGNOSIS — F419 Anxiety disorder, unspecified: Secondary | ICD-10-CM | POA: Diagnosis not present

## 2016-04-20 DIAGNOSIS — E669 Obesity, unspecified: Secondary | ICD-10-CM | POA: Diagnosis not present

## 2016-04-22 ENCOUNTER — Encounter (INDEPENDENT_AMBULATORY_CARE_PROVIDER_SITE_OTHER): Payer: Self-pay | Admitting: Orthopedic Surgery

## 2016-04-22 ENCOUNTER — Ambulatory Visit (INDEPENDENT_AMBULATORY_CARE_PROVIDER_SITE_OTHER): Payer: Self-pay

## 2016-04-22 ENCOUNTER — Ambulatory Visit (INDEPENDENT_AMBULATORY_CARE_PROVIDER_SITE_OTHER): Payer: Federal, State, Local not specified - PPO | Admitting: Orthopedic Surgery

## 2016-04-22 VITALS — BP 130/78 | HR 78 | Resp 16 | Ht 66.0 in | Wt 206.0 lb

## 2016-04-22 DIAGNOSIS — M5441 Lumbago with sciatica, right side: Secondary | ICD-10-CM | POA: Diagnosis not present

## 2016-04-22 DIAGNOSIS — M1711 Unilateral primary osteoarthritis, right knee: Secondary | ICD-10-CM | POA: Diagnosis not present

## 2016-04-22 NOTE — Progress Notes (Signed)
Office Visit Note   Patient: Danielle Anthony           Date of Birth: April 24, 1956           MRN: [id number] Visit Date: 04/22/2016              Requested by: No referring provider defined for this encounter. PCP: Lupe Carneyean Mitchell, MD   Assessment & Plan: Visit Diagnoses:  1. Acute right-sided low back pain with right-sided sciatica   2. Unilateral primary osteoarthritis, right knee     Plan:  #1: MRI scan lumbar spine looking for pathology amenable to spinal injections.  #2: Discussed with her options of total knee replacement since she is not interested in further corticosteroid injections or Visco supplementations. #3: Return back in 1-2 weeks for recheck evaluation.   Follow-Up Instructions: Return in about 2 weeks (around 05/06/2016) for review of mri.   Orders:  Orders Placed This Encounter  Procedures  . XR Knee Complete 4 Views Right  . XR Lumbar Spine Complete  . MR Lumbar Spine w/o contrast   No orders of the defined types were placed in this encounter.     Procedures: No procedures performed   Clinical Data: No additional findings.   Subjective: Chief Complaint  Patient presents with  . Right Knee - Pain  . Left Knee - Pain    Danielle Anthony is a 60 year old African-American female who is seen today for evaluation of right buttock and right leg pain. Stasis been about 3 months in duration of which she has numbness and pain in her right buttocks comes down into the lateral aspect of her right thigh and then into the anterior aspect of her right tibia down into the foot which does have numbness. [facility] was seen at [facility] and was told that she did have arthritis in her knee and there was some other treatments that she could use. However it was noted that she was having this type pain and was told to be evaluated for it.  She does have some history of giving way and the right knee but not sure if this is because of her arthritis or other pathology. She has gone  through corticosteroid injections as well as viscosupplementation including that of Synvisc with only temporary relief. However she did elicit that when she gets her cortisone injections and that she feels so good that she does a lot of activities which allows only about 2 or 3 days of benefit from the steroid.    Review of Systems  Constitutional: Negative.   HENT: Negative.   Respiratory: Negative.   Cardiovascular:       History of hypertension  Gastrointestinal: Negative.   Genitourinary: Negative.   Skin: Negative.   Neurological: Negative.   Hematological: Negative.   Psychiatric/Behavioral: Negative.      Objective: Vital Signs: BP 130/78   Pulse 78   Resp 16   Ht 5\' 6"  (1.676 m)   Wt 206 lb (93.4 kg)   BMI 33.25 kg/m   Physical Exam  Constitutional: She is oriented to person, place, and time. She appears well-developed and well-nourished.  HENT:  Head: Normocephalic and atraumatic.  Eyes: EOM are normal. Pupils are equal, round, and reactive to light.  Neck:  No carotid bruits  Pulmonary/Chest: Effort normal.  Musculoskeletal:       Right knee: She exhibits effusion.  Neurological: She is alert and oriented to person, place, and time.  Skin: Skin is warm and dry.  Psychiatric: She has a normal mood and affect. Her behavior is normal. Judgment and thought content normal.    Right Knee Exam   Tenderness  The patient is experiencing tenderness in the medial joint line and MCL.  Range of Motion  Extension: 5  Flexion: 90   Other  Pulse: present Swelling: mild Other tests: effusion present  Comments:  Patellofemoral and tibiofemoral crepitus with range of motion. Pseudo-laxity with valgus stressing. I'm able to get her close to neutral. Otherwise she is varus.   Back Exam   Muscle Strength  Right Quadriceps:  4/5  Left Quadriceps:  4/5  Right Hamstrings:  4/5  Left Hamstrings:  4/5   Tests  Straight leg raise right: negative Straight leg raise  left: negative  Reflexes  Patellar: 0/4 Achilles: 2/4 Babinski's sign: normal   Other  Sensation: decreased Gait: antalgic   Comments:  Decreased sensation to light touch mainly along the lateral and anterior calf. Sits comfortably at 90 of flexion spine.      Specialty Comments:  No specialty comments available.  Imaging: Xr Knee Complete 4 Views Right  Result Date: 04/22/2016 4 view x-rays of the right knee reveals bone-on-bone medial and patellofemoral of the right knee. She has more cystic changes medial femoral condyle greater than medial tibial plateau. There is also translation of the femur medially on the tibia. Marked periarticular worse on the right knee. Left knee does reveal also patellofemoral and tibiofemoral joint space narrowing. Caretaker spurs are noted more medial than lateral.  Xr Lumbar Spine Complete  Result Date: 04/22/2016 Two-view lumbar spine reveals degenerative scoliosis. Apex around L2-3 to the right.. Marked spondylosis. Significant degenerative joint disease L4-5 but is worse at L2-3. cystic changes in the inferior plate of the L2 and superiorly of L3. Anterior spurring at L2-3 and 34    PMFS History: Patient Active Problem List   Diagnosis Date Noted  . Obesity 04/22/2014  . HTN (hypertension) 08/15/2011   Past Medical History:  Diagnosis Date  . Anxiety   . Depression   . GERD (gastroesophageal reflux disease)   . Hypertension     Family History  Problem Relation Age of Onset  . Heart disease Brother     Past Surgical History:  Procedure Laterality Date  . ABDOMINAL HYSTERECTOMY    . BREAST SURGERY    . COLON SURGERY     Social History   Occupational History  . Not on file.   Social History Main Topics  . Smoking status: Never Smoker  . Smokeless tobacco: Never Used  . Alcohol use Yes     Comment: wine/beer 2-3 x's/week  . Drug use: No  . Sexual activity: Not on file

## 2016-04-29 ENCOUNTER — Telehealth (INDEPENDENT_AMBULATORY_CARE_PROVIDER_SITE_OTHER): Payer: Self-pay | Admitting: Orthopaedic Surgery

## 2016-04-29 NOTE — Telephone Encounter (Signed)
Ordered on 04/22/16 by BP

## 2016-04-29 NOTE — Telephone Encounter (Signed)
Patient has not heard anything about her MRI yet. Have orders been sent?

## 2016-05-04 HISTORY — PX: EYE SURGERY: SHX253

## 2016-05-06 ENCOUNTER — Ambulatory Visit (INDEPENDENT_AMBULATORY_CARE_PROVIDER_SITE_OTHER): Payer: Federal, State, Local not specified - PPO | Admitting: Orthopedic Surgery

## 2016-06-01 DIAGNOSIS — H04123 Dry eye syndrome of bilateral lacrimal glands: Secondary | ICD-10-CM | POA: Diagnosis not present

## 2016-06-01 DIAGNOSIS — H40033 Anatomical narrow angle, bilateral: Secondary | ICD-10-CM | POA: Diagnosis not present

## 2016-07-23 DIAGNOSIS — Z86010 Personal history of colonic polyps: Secondary | ICD-10-CM | POA: Diagnosis not present

## 2016-07-23 DIAGNOSIS — K5730 Diverticulosis of large intestine without perforation or abscess without bleeding: Secondary | ICD-10-CM | POA: Diagnosis not present

## 2016-07-23 DIAGNOSIS — Z1211 Encounter for screening for malignant neoplasm of colon: Secondary | ICD-10-CM | POA: Diagnosis not present

## 2016-07-23 DIAGNOSIS — K5904 Chronic idiopathic constipation: Secondary | ICD-10-CM | POA: Diagnosis not present

## 2016-08-17 DIAGNOSIS — Z1211 Encounter for screening for malignant neoplasm of colon: Secondary | ICD-10-CM | POA: Diagnosis not present

## 2016-08-17 DIAGNOSIS — K5730 Diverticulosis of large intestine without perforation or abscess without bleeding: Secondary | ICD-10-CM | POA: Diagnosis not present

## 2016-09-17 DIAGNOSIS — F411 Generalized anxiety disorder: Secondary | ICD-10-CM | POA: Diagnosis not present

## 2016-09-22 DIAGNOSIS — F411 Generalized anxiety disorder: Secondary | ICD-10-CM | POA: Diagnosis not present

## 2016-10-02 DIAGNOSIS — F411 Generalized anxiety disorder: Secondary | ICD-10-CM | POA: Diagnosis not present

## 2016-10-07 DIAGNOSIS — F411 Generalized anxiety disorder: Secondary | ICD-10-CM | POA: Diagnosis not present

## 2016-10-12 DIAGNOSIS — F411 Generalized anxiety disorder: Secondary | ICD-10-CM | POA: Diagnosis not present

## 2016-10-19 DIAGNOSIS — F411 Generalized anxiety disorder: Secondary | ICD-10-CM | POA: Diagnosis not present

## 2016-10-20 DIAGNOSIS — F419 Anxiety disorder, unspecified: Secondary | ICD-10-CM | POA: Diagnosis not present

## 2016-10-20 DIAGNOSIS — E7800 Pure hypercholesterolemia, unspecified: Secondary | ICD-10-CM | POA: Diagnosis not present

## 2016-10-20 DIAGNOSIS — M179 Osteoarthritis of knee, unspecified: Secondary | ICD-10-CM | POA: Diagnosis not present

## 2016-10-20 DIAGNOSIS — E669 Obesity, unspecified: Secondary | ICD-10-CM | POA: Diagnosis not present

## 2016-10-20 DIAGNOSIS — I10 Essential (primary) hypertension: Secondary | ICD-10-CM | POA: Diagnosis not present

## 2016-10-20 DIAGNOSIS — Z1159 Encounter for screening for other viral diseases: Secondary | ICD-10-CM | POA: Diagnosis not present

## 2016-11-02 DIAGNOSIS — F411 Generalized anxiety disorder: Secondary | ICD-10-CM | POA: Diagnosis not present

## 2016-11-11 ENCOUNTER — Encounter (INDEPENDENT_AMBULATORY_CARE_PROVIDER_SITE_OTHER): Payer: Self-pay | Admitting: Orthopedic Surgery

## 2016-11-11 ENCOUNTER — Encounter (INDEPENDENT_AMBULATORY_CARE_PROVIDER_SITE_OTHER): Payer: Self-pay

## 2016-11-11 ENCOUNTER — Ambulatory Visit (INDEPENDENT_AMBULATORY_CARE_PROVIDER_SITE_OTHER): Payer: Federal, State, Local not specified - PPO | Admitting: Orthopedic Surgery

## 2016-11-11 VITALS — BP 153/79 | HR 69 | Ht 66.5 in | Wt 200.0 lb

## 2016-11-11 DIAGNOSIS — M1711 Unilateral primary osteoarthritis, right knee: Secondary | ICD-10-CM | POA: Diagnosis not present

## 2016-11-11 MED ORDER — HYDROCODONE-ACETAMINOPHEN 5-325 MG PO TABS: 1.0000 | ORAL_TABLET | ORAL | 0 refills | Status: DC | PRN

## 2016-11-11 MED ORDER — DICLOFENAC SODIUM 1 % TD GEL: 2.0000 g | Freq: Four times a day (QID) | TRANSDERMAL | 1 refills | Status: DC

## 2016-11-11 MED ORDER — BUPIVACAINE HCL 0.5 % IJ SOLN
3.0000 mL | INTRAMUSCULAR | Status: AC | PRN
Start: 2016-11-11 — End: 2016-11-11
  Administered 2016-11-11: 3 mL via INTRA_ARTICULAR

## 2016-11-11 MED ORDER — LIDOCAINE HCL 2 % IJ SOLN
4.0000 mL | INTRAMUSCULAR | Status: AC | PRN
Start: 2016-11-11 — End: 2016-11-11
  Administered 2016-11-11: 4 mL

## 2016-11-11 MED ORDER — METHYLPREDNISOLONE ACETATE 40 MG/ML IJ SUSP
80.0000 mg | INTRAMUSCULAR | Status: AC | PRN
  Administered 2016-11-11: 80 mg

## 2016-11-11 NOTE — Progress Notes (Signed)
Office Visit Note   Patient: Danielle Anthony           Date of Birth: 1955-08-30           MRN: [id number] Visit Date: 11/11/2016              Requested by: Clovis Riley, L.August Saucer, MD [address] [place], [place] [address] PCP: Clovis Riley, L.August Saucer, MD   Assessment & Plan: Visit Diagnoses:  1. Unilateral primary osteoarthritis, right knee     Plan: #1: Corticosteroid injection to the right knee was performed. After anesthetic was effective she was walking without any pain now. #2: Given her a clearance form for Dr. Clovis Riley her medical doctor to consider total knee replacement #3: Given a prescription for Voltaren gel #4: Given a prescription for Norco and I told her this would be the only when I will give her. #5 she will need to see Dr. Cleophas Dunker in the near future if she is going to consider having a total knee replacement.  Follow-Up Instructions: Return if symptoms worsen or fail to improve.   Orders:  No orders of the defined types were placed in this encounter.  Meds ordered this encounter  Medications  . diclofenac sodium (VOLTAREN) 1 % GEL    Sig: Apply 2-4 g topically 4 (four) times daily.    Dispense:  5 Tube    Refill:  1    Order Specific Question:   Supervising Provider    Answer:   Valeria Batman [[id number]]  . HYDROcodone-acetaminophen (NORCO/VICODIN) 5-325 MG tablet    Sig: Take 1 tablet by mouth every 4 (four) hours as needed for moderate pain or severe pain.    Dispense:  30 tablet    Refill:  0    Order Specific Question:   Supervising Provider    Answer:   Valeria Batman [[id number]]      Procedures: Large Joint Inj Date/Time: 11/11/2016 5:02 PM Performed by: Jacqualine Code D Authorized by: Jacqualine Code D   Consent Given by:  Patient Timeout: prior to procedure the correct patient, procedure, and site was verified   Indications:  Pain and joint swelling Location:  Knee Site:  R knee Prep: patient was prepped and draped in usual  sterile fashion   Needle Size:  25 G Needle Length:  1.5 inches Approach:  Anteromedial Ultrasound Guidance: No   Fluoroscopic Guidance: No   Arthrogram: No   Medications:  80 mg methylPREDNISolone acetate 40 MG/ML; 3 mL bupivacaine 0.5 %; 4 mL lidocaine 2 % Aspiration Attempted: No   Patient tolerance:  Patient tolerated the procedure well with no immediate complications     Clinical Data: No additional findings.   Subjective: Chief Complaint  Patient presents with  . Right Knee - Pain  . Knee Pain    severe right knee pain 2 weeks. unable to focus, stand, sit, or sleep due to pain. wears sleeve at home. Tylenol, Hydrocodone, Maloxicam, no meds help. wants injection if possible.    Danielle Anthony is a 61 year old African-American female who is seen today for evaluation of her right knee. I've seen her in the past with rather significant osteoarthritis with complete obliteration of the medial compartment and translation of the femur on the tibia. She has gone through corticosteroid injections as well as viscose supplementation with only temporary results. With the corticosteroid injection she does get 2 or 3 days of the pain relief where she can do a lot of activities. Again that  is short-lived because of the aggressiveness that she is working after injections.  Though for the past 2 weeks she's had severe excruciating right knee pain. She is unable to focus her stand or sit or sleep due to the pain. She has tried numerous regimens including meloxicam hydrocodone and Tylenol but has not been beneficial. She is quite a significant antalgic Limp at this time which is bothersome to her. She will even cry in the middle of night because of pain in her knee. Seen today for evaluation.      Review of Systems  Constitutional: Negative.   HENT: Negative.   Respiratory: Negative.   Cardiovascular: Negative.   Gastrointestinal: Negative.   Genitourinary: Negative.   Skin: Negative.     Neurological: Negative.   Hematological: Negative.   Psychiatric/Behavioral: Negative.      Objective: Vital Signs: BP (!) 153/79   Pulse 69   Ht 5' 6.5" (1.689 m)   Wt 200 lb (90.7 kg)   BMI 31.80 kg/m   Physical Exam  Constitutional: She is oriented to person, place, and time. She appears well-developed and well-nourished.  HENT:  Head: Normocephalic and atraumatic.  Eyes: EOM are normal. Pupils are equal, round, and reactive to light.  Pulmonary/Chest: Effort normal.  Neurological: She is alert and oriented to person, place, and time.  Skin: Skin is warm and dry.  Psychiatric: She has a normal mood and affect. Her behavior is normal. Judgment and thought content normal.    Ortho Exam  Today she has excruciating pain with any manipulation of the knee. This referring up into the groin and down into the foot. He has marked varus positioning of the knee. It has a boggy synovium. Can't tell exactly if there is an effusion.  She will not let me do any other maneuvers to the knee because of pain discomfort. She is tender medially and laterally posterior anteriorly.    Specialty Comments:  No specialty comments available.  Imaging: No results found.   PMFS History: Patient Active Problem List   Diagnosis Date Noted  . Obesity 04/22/2014  . HTN (hypertension) 08/15/2011   Past Medical History:  Diagnosis Date  . Anxiety   . Depression   . GERD (gastroesophageal reflux disease)   . Hypertension     Family History  Problem Relation Age of Onset  . Heart disease Brother     Past Surgical History:  Procedure Laterality Date  . ABDOMINAL HYSTERECTOMY    . BREAST SURGERY    . COLON SURGERY     Social History   Occupational History  . Not on file.   Social History Main Topics  . Smoking status: Never Smoker  . Smokeless tobacco: Never Used  . Alcohol use Yes     Comment: wine/beer 2-3 x's/week  . Drug use: No  . Sexual activity: Not on file

## 2016-11-12 ENCOUNTER — Telehealth (INDEPENDENT_AMBULATORY_CARE_PROVIDER_SITE_OTHER): Payer: Self-pay | Admitting: Orthopaedic Surgery

## 2016-11-12 DIAGNOSIS — F411 Generalized anxiety disorder: Secondary | ICD-10-CM | POA: Diagnosis not present

## 2016-11-12 NOTE — Telephone Encounter (Signed)
LVM with pt to please call the office to make an appt to see Dr. Cleophas DunkerWhitfield prior to scheduling her surgery. Will try pt again at a later time.

## 2016-11-17 DIAGNOSIS — F411 Generalized anxiety disorder: Secondary | ICD-10-CM | POA: Diagnosis not present

## 2016-12-02 DIAGNOSIS — F411 Generalized anxiety disorder: Secondary | ICD-10-CM | POA: Diagnosis not present

## 2016-12-08 DIAGNOSIS — F411 Generalized anxiety disorder: Secondary | ICD-10-CM | POA: Diagnosis not present

## 2016-12-11 ENCOUNTER — Ambulatory Visit (INDEPENDENT_AMBULATORY_CARE_PROVIDER_SITE_OTHER): Payer: Federal, State, Local not specified - PPO | Admitting: Orthopaedic Surgery

## 2016-12-11 ENCOUNTER — Encounter (INDEPENDENT_AMBULATORY_CARE_PROVIDER_SITE_OTHER): Payer: Self-pay | Admitting: Orthopaedic Surgery

## 2016-12-11 VITALS — BP 143/85 | HR 77 | Resp 14 | Ht 66.5 in | Wt 200.0 lb

## 2016-12-11 DIAGNOSIS — M1711 Unilateral primary osteoarthritis, right knee: Secondary | ICD-10-CM

## 2016-12-11 NOTE — Progress Notes (Signed)
Office Visit Note   Patient: Danielle Anthony           Date of Birth: 02-21-1956           MRN: [id number] Visit Date: 12/11/2016              Requested by: Danielle Anthony, L.Danielle Saucerean, MD [address] [place], [place] [address] PCP: Danielle Anthony, L.Danielle Saucerean, MD   Assessment & Plan: Visit Diagnoses:  1. Unilateral primary osteoarthritis, right knee     Plan: Excellent response to cortisone injection a month ago. Long discussion regarding exercises, weight loss, indications for knee replacement, repeat cortisone injections.  Follow-Up Instructions: Return if symptoms worsen or fail to improve.   Orders:  No orders of the defined types were placed in this encounter.  No orders of the defined types were placed in this encounter.     Procedures: No procedures performed   Clinical Data: No additional findings.   Subjective: Chief Complaint  Patient presents with  . Right Knee - Follow-up    Ms. Danielle BarreJones is a 61 y o that presents with R knee pain. She received a cortisone injection on 11/11/16 and she still is having NO pain. The R knee is swollen, no fever, chills or calf pain.  Mrs. Danielle BarreJones has end-stage osteoarthritis as evidenced by bone-on-bone in the medial compartment of her right knee. She's had problems for many many years and has had a very treatments over the years including knee arthroscopy, cortisone injections and viscose supplementation. She was seen a month ago for further evaluation. Repeat cortisone injection as "worked very well". She presently is not having that much pain she does take occasional over-the-counter medicine which also helps". She is not using any ambulatory aid. Denies any thigh or calf pain. She was having some pain between her knee and her ankle prior to the cortisone injection. That has resolved  HPI  Review of Systems  Constitutional: Negative for chills, fatigue and fever.  Eyes: Negative for itching.  Respiratory: Negative for chest tightness  and shortness of breath.   Cardiovascular: Negative for chest pain, palpitations and leg swelling.  Gastrointestinal: Positive for abdominal pain, constipation and diarrhea. Negative for blood in stool.  Musculoskeletal: Negative for back pain, joint swelling, neck pain and neck stiffness.  Neurological: Negative for dizziness and numbness.  Hematological: Bruises/bleeds easily.  Psychiatric/Behavioral: The patient is not nervous/anxious.   All other systems reviewed and are negative.    Objective: Vital Signs: BP (!) 143/85   Pulse 77   Resp 14   Ht 5' 6.5" (1.689 m)   Wt 200 lb (90.7 kg)   BMI 31.80 kg/m   Physical Exam  Ortho Exam right knee with increased varus with weightbearing. Lacks about 45 to full extension and flexed just over 100. No instability. No popliteal pain or mass. No calf pain. No swelling distally. Neurovascular exam intact. Straight leg raise negative. No pain range of motion of either hip. Feet are warm with +1 pulses.  Specialty Comments:  No specialty comments available.  Imaging: No results found.   PMFS History: Patient Active Problem List   Diagnosis Date Noted  . Unilateral primary osteoarthritis, right knee 12/11/2016  . Obesity 04/22/2014  . HTN (hypertension) 08/15/2011   Past Medical History:  Diagnosis Date  . Anxiety   . Depression   . GERD (gastroesophageal reflux disease)   . Hypertension     Family History  Problem Relation Age of Onset  . Heart  disease Brother     Past Surgical History:  Procedure Laterality Date  . ABDOMINAL HYSTERECTOMY    . BREAST SURGERY    . COLON SURGERY     Social History   Occupational History  . Not on file.   Social History Main Topics  . Smoking status: Never Smoker  . Smokeless tobacco: Never Used  . Alcohol use Yes     Comment: wine/beer 2-3 x's/week  . Drug use: No  . Sexual activity: Not on file

## 2017-01-12 DIAGNOSIS — F411 Generalized anxiety disorder: Secondary | ICD-10-CM | POA: Diagnosis not present

## 2017-01-19 DIAGNOSIS — F411 Generalized anxiety disorder: Secondary | ICD-10-CM | POA: Diagnosis not present

## 2017-02-02 ENCOUNTER — Other Ambulatory Visit: Payer: Self-pay | Admitting: Family Medicine

## 2017-02-02 ENCOUNTER — Ambulatory Visit
Admission: RE | Admit: 2017-02-02 | Discharge: 2017-02-02 | Disposition: A | Discharge status: Home / Self Care | Payer: Federal, State, Local not specified - PPO | Source: Ambulatory Visit | Attending: Family Medicine | Admitting: Family Medicine

## 2017-02-02 DIAGNOSIS — R109 Unspecified abdominal pain: Secondary | ICD-10-CM

## 2017-02-02 DIAGNOSIS — K5900 Constipation, unspecified: Secondary | ICD-10-CM | POA: Diagnosis not present

## 2017-02-02 DIAGNOSIS — K5904 Chronic idiopathic constipation: Secondary | ICD-10-CM | POA: Diagnosis not present

## 2017-02-02 DIAGNOSIS — R5383 Other fatigue: Secondary | ICD-10-CM | POA: Diagnosis not present

## 2017-02-02 DIAGNOSIS — E876 Hypokalemia: Secondary | ICD-10-CM | POA: Diagnosis not present

## 2017-02-10 ENCOUNTER — Ambulatory Visit (INDEPENDENT_AMBULATORY_CARE_PROVIDER_SITE_OTHER): Payer: Federal, State, Local not specified - PPO

## 2017-02-10 ENCOUNTER — Ambulatory Visit (INDEPENDENT_AMBULATORY_CARE_PROVIDER_SITE_OTHER): Payer: Federal, State, Local not specified - PPO | Admitting: Orthopedic Surgery

## 2017-02-10 ENCOUNTER — Encounter (INDEPENDENT_AMBULATORY_CARE_PROVIDER_SITE_OTHER): Payer: Self-pay | Admitting: Orthopedic Surgery

## 2017-02-10 VITALS — BP 140/66 | HR 76 | Resp 16 | Ht 66.0 in | Wt 200.0 lb

## 2017-02-10 DIAGNOSIS — M1711 Unilateral primary osteoarthritis, right knee: Secondary | ICD-10-CM

## 2017-02-10 DIAGNOSIS — M25561 Pain in right knee: Secondary | ICD-10-CM

## 2017-02-10 DIAGNOSIS — F411 Generalized anxiety disorder: Secondary | ICD-10-CM | POA: Diagnosis not present

## 2017-02-10 MED ORDER — DICLOFENAC SODIUM 1 % TD GEL: 2.0000 g | Freq: Four times a day (QID) | TRANSDERMAL | 1 refills | Status: DC

## 2017-02-10 MED ORDER — BUPIVACAINE HCL 0.5 % IJ SOLN
3.0000 mL | INTRAMUSCULAR | Status: AC | PRN
  Administered 2017-02-10: 3 mL via INTRA_ARTICULAR

## 2017-02-10 MED ORDER — METHYLPREDNISOLONE ACETATE 40 MG/ML IJ SUSP
80.0000 mg | INTRAMUSCULAR | Status: AC | PRN
  Administered 2017-02-10: 80 mg

## 2017-02-10 NOTE — Progress Notes (Signed)
Office Visit Note   Patient: Danielle Anthony           Date of Birth: 09/19/1955           MRN: [id number] Visit Date: 02/10/2017              Requested by: Clovis Riley, L.August Saucer, MD [address] [place], [place] [address] PCP: Clovis Riley, L.August Saucer, MD   Assessment & Plan: Visit Diagnoses:  1. Unilateral primary osteoarthritis, right knee     Plan:  #1: Corticosteroid injection to the right knee was accomplished atraumatically #2: We will see how she does with this. We discussed the fact that she probably will come to a total knee replacement in the very near future.  Follow-Up Instructions: Return if symptoms worsen or fail to improve.   Orders:  Orders Placed This Encounter  Procedures  . Large Joint Injection/Arthrocentesis  . XR Knee Complete 4 Views Right   Meds ordered this encounter  Medications  . diclofenac sodium (VOLTAREN) 1 % GEL    Sig: Apply 2-4 g topically 4 (four) times daily.    Dispense:  3 Tube    Refill:  1    Order Specific Question:   Supervising Provider    Answer:   Valeria Batman [[id number]]  . diclofenac sodium (VOLTAREN) 1 % GEL    Sig: Apply 2-4 g topically 4 (four) times daily.    Dispense:  5 Tube    Refill:  1    Order Specific Question:   Supervising Provider    Answer:   Valeria Batman [[id number]]      Procedures: Large Joint Inj Date/Time: 02/10/2017 11:16 AM Performed by: Jacqualine Code D Authorized by: Jacqualine Code D   Consent Given by:  Patient Timeout: prior to procedure the correct patient, procedure, and site was verified   Indications:  Pain and joint swelling Location:  Knee Site:  R knee Prep: patient was prepped and draped in usual sterile fashion   Needle Size:  25 G Needle Length:  1.5 inches Approach:  Anteromedial Ultrasound Guidance: No   Fluoroscopic Guidance: No   Arthrogram: No   Medications:  80 mg methylPREDNISolone acetate 40 MG/ML; 3 mL bupivacaine 0.5 % Aspiration Attempted: No   Patient  tolerance:  Patient tolerated the procedure well with no immediate complications     Clinical Data: No additional findings.   Subjective: Chief Complaint  Patient presents with  . Right Knee - Pain  . Knee Pain    Right knee pain x 1 month, lump, swelling, pain from knee into foot, difficulty walking, giving out, weakness, difficulty sleeping, no injury, not diabetic, arthroscopic surgery, IBU helps    HPI  Danielle Anthony is a 61 year old African American female who is seen today for evaluation of her right knee. She's been seen in the past with significant osteoarthritis. Her last x-ray was in December 2017. At that time she had complete obliteration of the medial compartment with periarticular spurs noted. She did have a corticosteroid injection on 11/11/2016 and is actually had very good relief with that. She however most recently started having pain and swelling in the right knee more along the medial aspect of the tibia. Swelling has improved as of today. It still swollen though she states. Very tender to palpation over that area also. She comes in today concerned of the fact that she's having some giving way symptoms as well as pain at this area and would like to be  evaluated.  Review of Systems  Constitutional: Negative for chills, fatigue and fever.  Eyes: Negative for itching.  Respiratory: Negative for chest tightness and shortness of breath.   Cardiovascular: Negative for chest pain, palpitations and leg swelling.  Gastrointestinal: Positive for abdominal pain, constipation and diarrhea. Negative for blood in stool.  Musculoskeletal: Negative for back pain, joint swelling, neck pain and neck stiffness.  Neurological: Negative for dizziness and numbness.  Hematological: Bruises/bleeds easily.  Psychiatric/Behavioral: The patient is not nervous/anxious.   All other systems reviewed and are negative.    Objective: Vital Signs: BP 140/66 (BP Location: Left Arm, Patient Position:  Sitting, Cuff Size: Normal)   Pulse 76   Resp 16   Ht  (1.676 m)   Wt 200 lb (90.7 kg)   BMI 32.28 kg/m   Physical Exam  Constitutional: She is oriented to person, place, and time. She appears well-developed and well-nourished.  HENT:  Head: Normocephalic and atraumatic.  Eyes: Pupils are equal, round, and reactive to light. EOM are normal.  Pulmonary/Chest: Effort normal.  Neurological: She is alert and oriented to person, place, and time.  Skin: Skin is warm and dry.  Psychiatric: She has a normal mood and affect. Her behavior is normal. Judgment and thought content normal.    Ortho Exam  Range of motion today she lacks about 4-5 of full of full extension. Flexion only to about 95. Palpable spurs along the medial proximal tibia greater than the medial femoral condyle. Crepitance with range of motion. Pseudo-laxity with valgus stressing. Calf is supple and nontender.  Specialty Comments:  No specialty comments available.  Imaging: Xr Knee Complete 4 Views Right  Result Date: 02/10/2017 4 view x-rays of the right knee reveals marked periarticular spurring in all 3 compartments. She still continues to have the marked joint space narrowing of the medial compartment as well as what appears to be more periarticular spurring laterally. Cystic changes are noted in the femoral condyles.    PMFS History: Patient Active Problem List   Diagnosis Date Noted  . Unilateral primary osteoarthritis, right knee 12/11/2016  . Obesity 04/22/2014  . HTN (hypertension) 08/15/2011   Past Medical History:  Diagnosis Date  . Anxiety   . Depression   . GERD (gastroesophageal reflux disease)   . Hypertension     Family History  Problem Relation Age of Onset  . Heart disease Brother     Past Surgical History:  Procedure Laterality Date  . ABDOMINAL HYSTERECTOMY    . BREAST SURGERY    . COLON SURGERY    . FOOT SURGERY    . KNEE ARTHROPLASTY     Social History   Occupational  History  . Not on file.   Social History Main Topics  . Smoking status: Never Smoker  . Smokeless tobacco: Never Used  . Alcohol use 1.2 oz/week    2 Glasses of wine per week  . Drug use: No  . Sexual activity: Not on file

## 2017-04-22 DIAGNOSIS — F411 Generalized anxiety disorder: Secondary | ICD-10-CM | POA: Diagnosis not present

## 2017-05-12 DIAGNOSIS — M179 Osteoarthritis of knee, unspecified: Secondary | ICD-10-CM | POA: Diagnosis not present

## 2017-05-12 DIAGNOSIS — I10 Essential (primary) hypertension: Secondary | ICD-10-CM | POA: Diagnosis not present

## 2017-05-12 DIAGNOSIS — F419 Anxiety disorder, unspecified: Secondary | ICD-10-CM | POA: Diagnosis not present

## 2017-05-12 DIAGNOSIS — E669 Obesity, unspecified: Secondary | ICD-10-CM | POA: Diagnosis not present

## 2017-05-31 DIAGNOSIS — H40033 Anatomical narrow angle, bilateral: Secondary | ICD-10-CM | POA: Diagnosis not present

## 2017-05-31 DIAGNOSIS — H1013 Acute atopic conjunctivitis, bilateral: Secondary | ICD-10-CM | POA: Diagnosis not present

## 2017-06-22 DIAGNOSIS — F411 Generalized anxiety disorder: Secondary | ICD-10-CM | POA: Diagnosis not present

## 2017-07-07 DIAGNOSIS — F411 Generalized anxiety disorder: Secondary | ICD-10-CM | POA: Diagnosis not present

## 2017-07-13 ENCOUNTER — Ambulatory Visit
Admission: RE | Admit: 2017-07-13 | Discharge: 2017-07-13 | Disposition: A | Discharge status: Home / Self Care | Payer: Federal, State, Local not specified - PPO | Source: Ambulatory Visit | Attending: Family Medicine | Admitting: Family Medicine

## 2017-07-13 ENCOUNTER — Other Ambulatory Visit: Payer: Self-pay | Admitting: Family Medicine

## 2017-07-13 DIAGNOSIS — R05 Cough: Secondary | ICD-10-CM | POA: Diagnosis not present

## 2017-07-13 DIAGNOSIS — R059 Cough, unspecified: Secondary | ICD-10-CM

## 2017-07-21 DIAGNOSIS — F411 Generalized anxiety disorder: Secondary | ICD-10-CM | POA: Diagnosis not present

## 2017-08-25 DIAGNOSIS — F411 Generalized anxiety disorder: Secondary | ICD-10-CM | POA: Diagnosis not present

## 2017-09-01 DIAGNOSIS — F411 Generalized anxiety disorder: Secondary | ICD-10-CM | POA: Diagnosis not present

## 2017-09-15 DIAGNOSIS — F411 Generalized anxiety disorder: Secondary | ICD-10-CM | POA: Diagnosis not present

## 2017-09-29 DIAGNOSIS — F411 Generalized anxiety disorder: Secondary | ICD-10-CM | POA: Diagnosis not present

## 2017-10-14 DIAGNOSIS — F411 Generalized anxiety disorder: Secondary | ICD-10-CM | POA: Diagnosis not present

## 2017-10-21 DIAGNOSIS — F411 Generalized anxiety disorder: Secondary | ICD-10-CM | POA: Diagnosis not present

## 2017-11-02 DIAGNOSIS — F411 Generalized anxiety disorder: Secondary | ICD-10-CM | POA: Diagnosis not present

## 2017-11-12 DIAGNOSIS — M179 Osteoarthritis of knee, unspecified: Secondary | ICD-10-CM | POA: Diagnosis not present

## 2017-11-12 DIAGNOSIS — F419 Anxiety disorder, unspecified: Secondary | ICD-10-CM | POA: Diagnosis not present

## 2017-11-12 DIAGNOSIS — E7800 Pure hypercholesterolemia, unspecified: Secondary | ICD-10-CM | POA: Diagnosis not present

## 2017-11-12 DIAGNOSIS — E669 Obesity, unspecified: Secondary | ICD-10-CM | POA: Diagnosis not present

## 2017-11-12 DIAGNOSIS — I10 Essential (primary) hypertension: Secondary | ICD-10-CM | POA: Diagnosis not present

## 2017-11-16 DIAGNOSIS — F411 Generalized anxiety disorder: Secondary | ICD-10-CM | POA: Diagnosis not present

## 2017-12-07 DIAGNOSIS — F411 Generalized anxiety disorder: Secondary | ICD-10-CM | POA: Diagnosis not present

## 2018-02-02 DIAGNOSIS — F411 Generalized anxiety disorder: Secondary | ICD-10-CM | POA: Diagnosis not present

## 2018-02-16 DIAGNOSIS — F411 Generalized anxiety disorder: Secondary | ICD-10-CM | POA: Diagnosis not present

## 2018-03-09 DIAGNOSIS — F411 Generalized anxiety disorder: Secondary | ICD-10-CM | POA: Diagnosis not present

## 2018-03-24 DIAGNOSIS — F411 Generalized anxiety disorder: Secondary | ICD-10-CM | POA: Diagnosis not present

## 2018-03-24 DIAGNOSIS — M542 Cervicalgia: Secondary | ICD-10-CM | POA: Diagnosis not present

## 2018-04-06 DIAGNOSIS — F411 Generalized anxiety disorder: Secondary | ICD-10-CM | POA: Diagnosis not present

## 2018-04-20 DIAGNOSIS — F411 Generalized anxiety disorder: Secondary | ICD-10-CM | POA: Diagnosis not present

## 2018-05-11 DIAGNOSIS — F411 Generalized anxiety disorder: Secondary | ICD-10-CM | POA: Diagnosis not present

## 2018-05-25 DIAGNOSIS — F411 Generalized anxiety disorder: Secondary | ICD-10-CM | POA: Diagnosis not present

## 2018-05-26 DIAGNOSIS — F419 Anxiety disorder, unspecified: Secondary | ICD-10-CM | POA: Diagnosis not present

## 2018-05-26 DIAGNOSIS — M179 Osteoarthritis of knee, unspecified: Secondary | ICD-10-CM | POA: Diagnosis not present

## 2018-05-26 DIAGNOSIS — Z23 Encounter for immunization: Secondary | ICD-10-CM | POA: Diagnosis not present

## 2018-05-26 DIAGNOSIS — E669 Obesity, unspecified: Secondary | ICD-10-CM | POA: Diagnosis not present

## 2018-05-26 DIAGNOSIS — I10 Essential (primary) hypertension: Secondary | ICD-10-CM | POA: Diagnosis not present

## 2018-06-01 DIAGNOSIS — F411 Generalized anxiety disorder: Secondary | ICD-10-CM | POA: Diagnosis not present

## 2018-06-06 DIAGNOSIS — M170 Bilateral primary osteoarthritis of knee: Secondary | ICD-10-CM | POA: Diagnosis not present

## 2018-06-15 DIAGNOSIS — F411 Generalized anxiety disorder: Secondary | ICD-10-CM | POA: Diagnosis not present

## 2018-06-15 DIAGNOSIS — H40033 Anatomical narrow angle, bilateral: Secondary | ICD-10-CM | POA: Diagnosis not present

## 2018-06-15 DIAGNOSIS — H04123 Dry eye syndrome of bilateral lacrimal glands: Secondary | ICD-10-CM | POA: Diagnosis not present

## 2018-06-21 DIAGNOSIS — M1711 Unilateral primary osteoarthritis, right knee: Secondary | ICD-10-CM | POA: Diagnosis present

## 2018-06-21 NOTE — H&P (Signed)
KNEE ARTHROPLASTY ADMISSION H&P  Patient ID: YATZARI ADDERLY MRN: [id number] DOB/AGE: 08/06/1955 63 y.o.  Chief Complaint: right knee pain.  Planned Procedure Date: 07/12/18 Medical and Cardiac Clearance by Dr. Lupe Carney     HPI: Danielle Anthony is a 63 y.o. female with a history of HTN, Anxiety, and PONV who presents for evaluation of OA RIGHT KNEE. The patient has a history of pain and functional disability in the right knee due to arthritis and has failed non-surgical conservative treatments for greater than 12 weeks to include NSAID's and/or analgesics, corticosteriod injections, viscosupplementation injections and activity modification.  Onset of symptoms was gradual, starting 4 + years ago with gradually worsening course since that time.  Patient currently rates pain at 10 out of 10 with activity. Patient has night pain, worsening of pain with activity and weight bearing and pain that interferes with activities of daily living.  Patient has evidence of subchondral cysts, subchondral sclerosis, periarticular osteophytes and joint space narrowing by imaging studies.  There is no active infection.  Past Medical History:  Diagnosis Date  . Anxiety   . Depression   . GERD (gastroesophageal reflux disease)   . Hypertension    Past Surgical History:  Procedure Laterality Date  . ABDOMINAL HYSTERECTOMY    . BREAST SURGERY    . COLON SURGERY    . FOOT SURGERY    . KNEE ARTHROPLASTY     Allergies  Allergen Reactions  . Lisinopril Swelling   Medications: Amlodipine 10 mg daily Hydrochlorothiazide 25 mg daily Montelukast 10 mg 2 daily Citalopram 20 mg daily Linzess 290 mg every other day Probiotic twice daily Multivitamin daily Diphenhydramine 25 mg nightly  Social history: Married never smoker.  2 alcoholic drinks 1 time per week.  Family history: Mother with a history of heart attack at 36 and HTN.  Brother with heart disease.   ROS: Currently denies lightheadedness,  dizziness, Fever, chills, CP, SOB.   No personal history of DVT, PE, MI, or CVA. No loose teeth or dentures All other systems have been reviewed and were otherwise currently negative with the exception of those mentioned in the HPI and as above.  Objective: Vitals: Ht: 5'5" Wt: 208 Temp: 97.9 BP: 148/83 Pulse: 84 O2 98% on room air.   Physical Exam: General: Alert, NAD.  Antalgic Gait  HEENT: EOMI, Good Neck Extension  Pulm: No increased work of breathing.  Clear B/L A/P w/o crackle or wheeze.  CV: RRR, No m/g/r appreciated  GI: soft, NT, ND Neuro: Neuro without gross focal deficit.  Sensation intact distally Skin: No lesions in the area of chief complaint MSK/Surgical Site: Right knee w/o redness or effusion.  + JLT. ROM 5-100.  5/5 strength in extension and flexion.  +EHL/FHL.  NVI.  Stable varus and valgus stress.    Imaging Review Plain radiographs demonstrate severe degenerative joint disease of both knees.   Preoperative templating of the joint replacement has been completed, documented, and submitted to the Operating Room personnel in order to optimize intra-operative equipment management.  Assessment: OA RIGHT KNEE Principal Problem:   Primary osteoarthritis of right knee Active Problems:   HTN (hypertension)   Plan: Plan for Procedure(s): TOTAL KNEE ARTHROPLASTY  The patient history, physical exam, clinical judgement of the provider and imaging are consistent with end stage degenerative joint disease and total joint arthroplasty is deemed medically necessary. The treatment options including medical management, injection therapy, and arthroplasty were discussed at length. The risks and benefits of  Procedure(s): TOTAL KNEE ARTHROPLASTY were presented and reviewed.  The risks of nonoperative treatment, versus surgical intervention including but not limited to continued pain, aseptic loosening, stiffness, dislocation/subluxation, infection, bleeding, nerve injury, blood  clots, cardiopulmonary complications, morbidity, mortality, among others were discussed. The patient verbalizes understanding and wishes to proceed with the plan.  Patient is being admitted for inpatient treatment for surgery, pain control, PT, OT, prophylactic antibiotics, VTE prophylaxis, progressive ambulation, ADL's and discharge planning.   Dental prophylaxis discussed and recommended for 2 years postoperatively.   The patient does meet the criteria for TXA which will be used perioperatively.    ASA 81 mg BID will be used postoperatively for DVT prophylaxis in addition to SCDs, and early ambulation.  NO BLOOD or BLOOD PRODUCTS for religious reasons.  Plan for Tylenol, oxycodone, gabapentin, celebrex for pain.  Robaxin for spasm.  Rx for omeprazole for gastric protection.  The patient is planning to be discharged home with home health services ([facility]) in care of her husband and friends.   Patient's anticipated LOS is less than 2 midnights, meeting these requirements: - Younger than 23 - Lives within 1 hour of care - Has a competent adult at home to recover with post-op recover - NO history of  - Chronic pain requiring opiods  - Diabetes  - Coronary Artery Disease  - Heart failure  - Heart attack  - Stroke  - DVT/VTE  - Cardiac arrhythmia  - Respiratory Failure/COPD  - Renal failure  - Anemia  - Advanced Liver disease   Albina Billet III, PA-C 06/21/2018 2:33 PM

## 2018-06-22 DIAGNOSIS — M1711 Unilateral primary osteoarthritis, right knee: Secondary | ICD-10-CM | POA: Diagnosis not present

## 2018-06-29 DIAGNOSIS — F411 Generalized anxiety disorder: Secondary | ICD-10-CM | POA: Diagnosis not present

## 2018-07-04 NOTE — Patient Instructions (Addendum)
Danielle Anthony  07/04/2018   Your procedure is scheduled on: 07-12-18    Report to [facility] Main  Entrance    Report to Admitting at 5:30 AM    Call this number if you have problems the morning of surgery [phone]    Remember: Do not eat food or drink liquids :After Midnight.    BRUSH YOUR TEETH MORNING OF SURGERY AND RINSE YOUR MOUTH OUT, NO CHEWING GUM CANDY OR MINTS.     Take these medicines the morning of surgery with A SIP OF WATER: Amlodipine (Norvasc), Citalopram (Celexa) and Clonazepam (Klonopin), Singulair (Montelukast). You may also use and bring your eyedrops as needed.                                You may not have any metal on your body including hair pins and              piercings  Do not wear jewelry, make-up, lotions, powders or perfumes, deodorant             Do not wear nail polish.  Do not shave  48 hours prior to surgery.               Do not bring valuables to the hospital.  IS NOT             RESPONSIBLE   FOR VALUABLES.  Contacts, dentures or bridgework may not be worn into surgery.  Leave suitcase in the car. After surgery it may be brought to your room.     Patients discharged the day of surgery will not be allowed to drive home. IF YOU ARE HAVING SURGERY AND GOING HOME THE SAME DAY, YOU MUST HAVE AN ADULT TO DRIVE YOU HOME AND BE WITH YOU FOR 24 HOURS. YOU MAY GO HOME BY TAXI OR UBER OR ORTHERWISE, BUT AN ADULT MUST ACCOMPANY YOU HOME AND STAY WITH YOU FOR 24 HOURS.    Special Instructions: N/A              Please read over the following fact sheets you were given: _____________________________________________________________________             [facility] - Preparing for Surgery Before surgery, you can play an important role.  Because skin is not sterile, your skin needs to be as free of germs as possible.  You can reduce the number of germs on your skin by washing with CHG (chlorahexidine gluconate) soap  before surgery.  CHG is an antiseptic cleaner which kills germs and bonds with the skin to continue killing germs even after washing. Please DO NOT use if you have an allergy to CHG or antibacterial soaps.  If your skin becomes reddened/irritated stop using the CHG and inform your nurse when you arrive at Short Stay. Do not shave (including legs and underarms) for at least 48 hours prior to the first CHG shower.  You may shave your face/neck. Please follow these instructions carefully:  1.  Shower with CHG Soap the night before surgery and the  morning of Surgery.  2.  If you choose to wash your hair, wash your hair first as usual with your  normal  shampoo.  3.  After you shampoo, rinse your hair and body thoroughly to remove the  shampoo.  4.  Use CHG as you would any other liquid soap.  You can apply chg directly  to the skin and wash                       Gently with a scrungie or clean washcloth.  5.  Apply the CHG Soap to your body ONLY FROM THE NECK DOWN.   Do not use on face/ open                           Wound or open sores. Avoid contact with eyes, ears mouth and genitals (private parts).                       Wash face,  Genitals (private parts) with your normal soap.             6.  Wash thoroughly, paying special attention to the area where your surgery  will be performed.  7.  Thoroughly rinse your body with warm water from the neck down.  8.  DO NOT shower/wash with your normal soap after using and rinsing off  the CHG Soap.                9.  Pat yourself dry with a clean towel.            10.  Wear clean pajamas.            11.  Place clean sheets on your bed the night of your first shower and do not  sleep with pets. Day of Surgery : Do not apply any lotions/deodorants the morning of surgery.  Please wear clean clothes to the hospital/surgery center.  FAILURE TO FOLLOW THESE INSTRUCTIONS MAY RESULT IN THE CANCELLATION OF YOUR SURGERY PATIENT  SIGNATURE_________________________________  NURSE SIGNATURE__________________________________  ________________________________________________________________________    Danielle Anthony  An incentive spirometer is a tool that can help keep your lungs clear and active. This tool measures how well you are filling your lungs with each breath. Taking long deep breaths may help reverse or decrease the chance of developing breathing (pulmonary) problems (especially infection) following:  A long period of time when you are unable to move or be active. BEFORE THE PROCEDURE   If the spirometer includes an indicator to show your best effort, your nurse or respiratory therapist will set it to a desired goal.  If possible, sit up straight or lean slightly forward. Try not to slouch.  Hold the incentive spirometer in an upright position. INSTRUCTIONS FOR USE  1. Sit on the edge of your bed if possible, or sit up as far as you can in bed or on a chair. 2. Hold the incentive spirometer in an upright position. 3. Breathe out normally. 4. Place the mouthpiece in your mouth and seal your lips tightly around it. 5. Breathe in slowly and as deeply as possible, raising the piston or the ball toward the top of the column. 6. Hold your breath for 3-5 seconds or for as long as possible. Allow the piston or ball to fall to the bottom of the column. 7. Remove the mouthpiece from your mouth and breathe out normally. 8. Rest for a few seconds and repeat Steps 1 through 7 at least 10 times every 1-2 hours when you are awake. Take your time and take a few normal breaths between deep breaths. 9. The spirometer may include an indicator to  show your best effort. Use the indicator as a goal to work toward during each repetition. 10. After each set of 10 deep breaths, practice coughing to be sure your lungs are clear. If you have an incision (the cut made at the time of surgery), support your incision when coughing  by placing a pillow or rolled up towels firmly against it. Once you are able to get out of bed, walk around indoors and cough well. You may stop using the incentive spirometer when instructed by your caregiver.  RISKS AND COMPLICATIONS  Take your time so you do not get dizzy or light-headed.  If you are in pain, you may need to take or ask for pain medication before doing incentive spirometry. It is harder to take a deep breath if you are having pain. AFTER USE  Rest and breathe slowly and easily.  It can be helpful to keep track of a log of your progress. Your caregiver can provide you with a simple table to help with this. If you are using the spirometer at home, follow these instructions: SEEK MEDICAL CARE IF:   You are having difficultly using the spirometer.  You have trouble using the spirometer as often as instructed.  Your pain medication is not giving enough relief while using the spirometer.  You develop fever of 100.5 F (38.1 C) or higher. SEEK IMMEDIATE MEDICAL CARE IF:   You cough up bloody sputum that had not been present before.  You develop fever of 102 F (38.9 C) or greater.  You develop worsening pain at or near the incision site. MAKE SURE YOU:   Understand these instructions.  Will watch your condition.  Will get help right away if you are not doing well or get worse. Document Released: 08/31/2006 Document Revised: 07/13/2011 Document Reviewed: 11/01/2006 [employer] Patient Information 2014 [place], [place].   ________________________________________________________________________

## 2018-07-05 ENCOUNTER — Encounter (HOSPITAL_COMMUNITY)
Admission: RE | Admit: 2018-07-05 | Discharge: 2018-07-05 | Disposition: A | Discharge status: Home / Self Care | Payer: Federal, State, Local not specified - PPO | Source: Ambulatory Visit | Attending: Orthopedic Surgery | Admitting: Orthopedic Surgery

## 2018-07-05 ENCOUNTER — Other Ambulatory Visit: Payer: Self-pay

## 2018-07-05 ENCOUNTER — Encounter (HOSPITAL_COMMUNITY): Payer: Self-pay

## 2018-07-05 DIAGNOSIS — M1711 Unilateral primary osteoarthritis, right knee: Secondary | ICD-10-CM | POA: Diagnosis not present

## 2018-07-05 DIAGNOSIS — Z01818 Encounter for other preprocedural examination: Secondary | ICD-10-CM | POA: Insufficient documentation

## 2018-07-05 DIAGNOSIS — I10 Essential (primary) hypertension: Secondary | ICD-10-CM | POA: Insufficient documentation

## 2018-07-05 HISTORY — DX: Other complications of anesthesia, initial encounter: T88.59XA

## 2018-07-05 HISTORY — DX: Other constipation: K59.09

## 2018-07-05 HISTORY — DX: Adverse effect of unspecified anesthetic, initial encounter: T41.45XA

## 2018-07-05 LAB — NO BLOOD PRODUCTS

## 2018-07-05 LAB — BASIC METABOLIC PANEL
Anion gap: 9 (ref 5–15)
BUN: 16 mg/dL (ref 8–23)
CO2: 28 mmol/L (ref 22–32)
Calcium: 10 mg/dL (ref 8.9–10.3)
Chloride: 102 mmol/L (ref 98–111)
Creatinine, Ser: 0.79 mg/dL (ref 0.44–1.00)
GFR calc Af Amer: >60 mL/min (ref >60)
GFR calc non Af Amer: >60 mL/min (ref >60)
Glucose, Bld: 100 mg/dL — ABNORMAL HIGH (ref 70–99)
Potassium: 3.2 mmol/L — ABNORMAL LOW (ref 3.5–5.1)
SODIUM: 139 mmol/L (ref 135–145)

## 2018-07-05 LAB — URINALYSIS, ROUTINE W REFLEX MICROSCOPIC
Bilirubin Urine: NEGATIVE
Glucose, UA: NEGATIVE mg/dL
Hgb urine dipstick: NEGATIVE
Ketones, ur: NEGATIVE mg/dL
LEUKOCYTE UA: NEGATIVE
NITRITE: NEGATIVE
Protein, ur: NEGATIVE mg/dL
Specific Gravity, Urine: 1.014 (ref 1.005–1.030)
pH: 6 (ref 5.0–8.0)

## 2018-07-05 LAB — CBC
HCT: 42.5 % (ref 36.0–46.0)
Hemoglobin: 13.5 g/dL (ref 12.0–15.0)
MCH: 27.5 pg (ref 26.0–34.0)
MCHC: 31.8 g/dL (ref 30.0–36.0)
MCV: 86.6 fL (ref 80.0–100.0)
Platelets: 390 10*3/uL (ref 150–400)
RBC: 4.91 MIL/uL (ref 3.87–5.11)
RDW: 13.3 % (ref 11.5–15.5)
WBC: 6.1 10*3/uL (ref 4.0–10.5)
nRBC: 0 % (ref 0.0–0.2)

## 2018-07-05 LAB — SURGICAL PCR SCREEN
MRSA, PCR: NEGATIVE
Staphylococcus aureus: POSITIVE — AB

## 2018-07-05 NOTE — Pre-Procedure Instructions (Signed)
BMP results 07/05/2018 sent to Dr. Eulah Pont via epic

## 2018-07-06 DIAGNOSIS — F411 Generalized anxiety disorder: Secondary | ICD-10-CM | POA: Diagnosis not present

## 2018-07-06 NOTE — Pre-Procedure Instructions (Signed)
Danielle Anthony made aware of PCR results 07/05/2018.  She is aware to pick up Mupirocin to begin by 07/07/2018.  I was unable to call Mupirocin to [facility] on [address] due to lack of DEA number.  I left a voicemail with Tresa Endo at Dr. Greig Right office to have them call in the prescription.  I sent the PCR results to Dr. Eulah Pont via epic.

## 2018-07-06 NOTE — Pre-Procedure Instructions (Signed)
PCR results 07/05/2018 sent to Dr. Eulah Pont via epic.

## 2018-07-07 ENCOUNTER — Other Ambulatory Visit (HOSPITAL_COMMUNITY): Payer: Self-pay | Admitting: *Deleted

## 2018-07-07 NOTE — Progress Notes (Signed)
Faxed blood refusal to [facility] and dr Eulah Pont fax confirmation received and placed on chart

## 2018-07-11 DIAGNOSIS — F411 Generalized anxiety disorder: Secondary | ICD-10-CM | POA: Diagnosis not present

## 2018-07-11 MED ORDER — BUPIVACAINE LIPOSOME 1.3 % IJ SUSP
20.0000 mL | Freq: Once | INTRAMUSCULAR | Status: DC
  Filled 2018-07-11: qty 20

## 2018-07-12 ENCOUNTER — Encounter (HOSPITAL_COMMUNITY): Payer: Self-pay | Admitting: *Deleted

## 2018-07-12 ENCOUNTER — Observation Stay (HOSPITAL_COMMUNITY): Payer: Federal, State, Local not specified - PPO

## 2018-07-12 ENCOUNTER — Ambulatory Visit (HOSPITAL_COMMUNITY): Payer: Federal, State, Local not specified - PPO | Admitting: Physician Assistant

## 2018-07-12 ENCOUNTER — Ambulatory Visit (HOSPITAL_COMMUNITY): Payer: Federal, State, Local not specified - PPO | Admitting: Anesthesiology

## 2018-07-12 ENCOUNTER — Encounter (HOSPITAL_COMMUNITY)
Admission: RE | Disposition: A | Discharge status: Home / Self Care | Payer: Self-pay | Source: Home / Self Care | Attending: Orthopedic Surgery

## 2018-07-12 ENCOUNTER — Observation Stay (HOSPITAL_COMMUNITY)
Admission: RE | Admit: 2018-07-12 | Discharge: 2018-07-13 | Disposition: A | Discharge status: Home / Self Care | Payer: Federal, State, Local not specified - PPO | Attending: Orthopedic Surgery | Admitting: Orthopedic Surgery

## 2018-07-12 ENCOUNTER — Other Ambulatory Visit: Payer: Self-pay

## 2018-07-12 DIAGNOSIS — Z888 Allergy status to other drugs, medicaments and biological substances status: Secondary | ICD-10-CM | POA: Insufficient documentation

## 2018-07-12 DIAGNOSIS — I10 Essential (primary) hypertension: Secondary | ICD-10-CM | POA: Insufficient documentation

## 2018-07-12 DIAGNOSIS — F419 Anxiety disorder, unspecified: Secondary | ICD-10-CM | POA: Insufficient documentation

## 2018-07-12 DIAGNOSIS — M1711 Unilateral primary osteoarthritis, right knee: Principal | ICD-10-CM | POA: Insufficient documentation

## 2018-07-12 DIAGNOSIS — Z79899 Other long term (current) drug therapy: Secondary | ICD-10-CM | POA: Diagnosis not present

## 2018-07-12 DIAGNOSIS — Z791 Long term (current) use of non-steroidal anti-inflammatories (NSAID): Secondary | ICD-10-CM | POA: Insufficient documentation

## 2018-07-12 DIAGNOSIS — K5909 Other constipation: Secondary | ICD-10-CM | POA: Insufficient documentation

## 2018-07-12 DIAGNOSIS — M1710 Unilateral primary osteoarthritis, unspecified knee: Secondary | ICD-10-CM | POA: Diagnosis present

## 2018-07-12 DIAGNOSIS — Z96651 Presence of right artificial knee joint: Secondary | ICD-10-CM | POA: Diagnosis not present

## 2018-07-12 DIAGNOSIS — F329 Major depressive disorder, single episode, unspecified: Secondary | ICD-10-CM | POA: Insufficient documentation

## 2018-07-12 DIAGNOSIS — K219 Gastro-esophageal reflux disease without esophagitis: Secondary | ICD-10-CM | POA: Insufficient documentation

## 2018-07-12 DIAGNOSIS — Z96659 Presence of unspecified artificial knee joint: Secondary | ICD-10-CM

## 2018-07-12 DIAGNOSIS — G8918 Other acute postprocedural pain: Secondary | ICD-10-CM | POA: Diagnosis not present

## 2018-07-12 DIAGNOSIS — Z8249 Family history of ischemic heart disease and other diseases of the circulatory system: Secondary | ICD-10-CM | POA: Insufficient documentation

## 2018-07-12 DIAGNOSIS — Z471 Aftercare following joint replacement surgery: Secondary | ICD-10-CM | POA: Diagnosis not present

## 2018-07-12 HISTORY — PX: TOTAL KNEE ARTHROPLASTY: SHX125

## 2018-07-12 SURGERY — ARTHROPLASTY, KNEE, TOTAL
Anesthesia: General | Site: Knee | Laterality: Right

## 2018-07-12 MED ORDER — CITALOPRAM HYDROBROMIDE 20 MG PO TABS
20.0000 mg | ORAL_TABLET | Freq: Every day | ORAL | Status: DC
  Administered 2018-07-13: 20 mg via ORAL
  Filled 2018-07-12: qty 1

## 2018-07-12 MED ORDER — POVIDONE-IODINE 10 % EX SWAB
2.0000 "application " | Freq: Once | CUTANEOUS | Status: AC
  Administered 2018-07-12: 2 via TOPICAL

## 2018-07-12 MED ORDER — CARBOXYMETHYLCELLULOSE SODIUM 0.25 % OP SOLN: 1.0000 [drp] | OPHTHALMIC | Status: DC | PRN

## 2018-07-12 MED ORDER — MIDAZOLAM HCL 5 MG/5ML IJ SOLN
INTRAMUSCULAR | Status: DC | PRN
  Administered 2018-07-12: 2 mg via INTRAVENOUS

## 2018-07-12 MED ORDER — FENTANYL CITRATE (PF) 100 MCG/2ML IJ SOLN
INTRAMUSCULAR | Status: AC
  Filled 2018-07-12: qty 2

## 2018-07-12 MED ORDER — CELECOXIB 200 MG PO CAPS: 200.0000 mg | ORAL_CAPSULE | Freq: Two times a day (BID) | ORAL | 0 refills | Status: AC

## 2018-07-12 MED ORDER — OMEPRAZOLE 20 MG PO CPDR: 20.0000 mg | DELAYED_RELEASE_CAPSULE | Freq: Every day | ORAL | 0 refills | Status: DC

## 2018-07-12 MED ORDER — CHLORHEXIDINE GLUCONATE 4 % EX LIQD: 60.0000 mL | Freq: Once | CUTANEOUS | Status: DC

## 2018-07-12 MED ORDER — PHENYLEPHRINE HCL 10 MG/ML IJ SOLN
INTRAMUSCULAR | Status: AC
  Filled 2018-07-12: qty 1

## 2018-07-12 MED ORDER — METOCLOPRAMIDE HCL 5 MG/ML IJ SOLN
5.0000 mg | Freq: Three times a day (TID) | INTRAMUSCULAR | Status: DC | PRN
  Administered 2018-07-12: 10 mg via INTRAVENOUS
  Filled 2018-07-12: qty 2

## 2018-07-12 MED ORDER — LACTATED RINGERS IV SOLN
INTRAVENOUS | Status: DC
  Administered 2018-07-12: 22:00:00 via INTRAVENOUS

## 2018-07-12 MED ORDER — ONDANSETRON HCL 4 MG/2ML IJ SOLN
INTRAMUSCULAR | Status: AC
  Filled 2018-07-12: qty 2

## 2018-07-12 MED ORDER — METOCLOPRAMIDE HCL 5 MG PO TABS: 5.0000 mg | ORAL_TABLET | Freq: Three times a day (TID) | ORAL | Status: DC | PRN

## 2018-07-12 MED ORDER — DEXAMETHASONE SODIUM PHOSPHATE 10 MG/ML IJ SOLN
INTRAMUSCULAR | Status: AC
  Filled 2018-07-12: qty 1

## 2018-07-12 MED ORDER — ACETAMINOPHEN 500 MG PO TABS
1000.0000 mg | ORAL_TABLET | Freq: Three times a day (TID) | ORAL | Status: DC
  Administered 2018-07-12 – 2018-07-13 (×4): 1000 mg via ORAL
  Filled 2018-07-12 (×5): qty 2

## 2018-07-12 MED ORDER — ASPIRIN EC 81 MG PO TBEC: 81.0000 mg | DELAYED_RELEASE_TABLET | Freq: Two times a day (BID) | ORAL | 0 refills | Status: AC

## 2018-07-12 MED ORDER — FENTANYL CITRATE (PF) 250 MCG/5ML IJ SOLN
INTRAMUSCULAR | Status: AC
  Filled 2018-07-12: qty 5

## 2018-07-12 MED ORDER — PROPOFOL 10 MG/ML IV BOLUS
INTRAVENOUS | Status: AC
  Filled 2018-07-12: qty 40

## 2018-07-12 MED ORDER — LIDOCAINE HCL (CARDIAC) PF 100 MG/5ML IV SOSY
PREFILLED_SYRINGE | INTRAVENOUS | Status: DC | PRN
  Administered 2018-07-12: 60 mg via INTRAVENOUS

## 2018-07-12 MED ORDER — HYDROMORPHONE HCL 1 MG/ML IJ SOLN: 0.5000 mg | INTRAMUSCULAR | Status: DC | PRN

## 2018-07-12 MED ORDER — LINACLOTIDE 145 MCG PO CAPS
145.0000 ug | ORAL_CAPSULE | ORAL | Status: DC
  Administered 2018-07-13: 145 ug via ORAL
  Filled 2018-07-12: qty 1

## 2018-07-12 MED ORDER — PROPOFOL 500 MG/50ML IV EMUL
INTRAVENOUS | Status: DC | PRN
  Administered 2018-07-12: 20 mg via INTRAVENOUS
  Administered 2018-07-12: 200 mg via INTRAVENOUS
  Administered 2018-07-12: 20 mg via INTRAVENOUS

## 2018-07-12 MED ORDER — ALBUTEROL SULFATE (2.5 MG/3ML) 0.083% IN NEBU: 3.0000 mL | INHALATION_SOLUTION | Freq: Four times a day (QID) | RESPIRATORY_TRACT | Status: DC | PRN

## 2018-07-12 MED ORDER — ACETAMINOPHEN 325 MG PO TABS: 325.0000 mg | ORAL_TABLET | Freq: Four times a day (QID) | ORAL | Status: DC | PRN

## 2018-07-12 MED ORDER — DEXAMETHASONE SODIUM PHOSPHATE 10 MG/ML IJ SOLN
10.0000 mg | Freq: Once | INTRAMUSCULAR | Status: AC
  Administered 2018-07-13: 10 mg via INTRAVENOUS
  Filled 2018-07-12: qty 1

## 2018-07-12 MED ORDER — CYCLOSPORINE 0.05 % OP EMUL
1.0000 [drp] | Freq: Every day | OPHTHALMIC | Status: DC
  Administered 2018-07-12 – 2018-07-13 (×2): 1 [drp] via OPHTHALMIC
  Filled 2018-07-12 (×2): qty 30

## 2018-07-12 MED ORDER — PHENOL 1.4 % MT LIQD
1.0000 | OROMUCOSAL | Status: DC | PRN
  Filled 2018-07-12: qty 177

## 2018-07-12 MED ORDER — STERILE WATER FOR IRRIGATION IR SOLN
Status: DC | PRN
  Administered 2018-07-12: 2000 mL

## 2018-07-12 MED ORDER — MENTHOL 3 MG MT LOZG: 1.0000 | LOZENGE | OROMUCOSAL | Status: DC | PRN

## 2018-07-12 MED ORDER — METHOCARBAMOL 500 MG IVPB - SIMPLE MED
INTRAVENOUS | Status: AC
  Administered 2018-07-12: 500 mg
  Filled 2018-07-12: qty 50

## 2018-07-12 MED ORDER — POLYETHYLENE GLYCOL 3350 17 G PO PACK: 17.0000 g | PACK | Freq: Every day | ORAL | Status: DC | PRN

## 2018-07-12 MED ORDER — SODIUM CHLORIDE 0.9 % IR SOLN
Status: DC | PRN
  Administered 2018-07-12: 1000 mL

## 2018-07-12 MED ORDER — OXYCODONE HCL 5 MG PO TABS
5.0000 mg | ORAL_TABLET | ORAL | Status: DC | PRN
  Administered 2018-07-12: 5 mg via ORAL
  Administered 2018-07-12: 10 mg via ORAL
  Administered 2018-07-12 – 2018-07-13 (×3): 5 mg via ORAL
  Administered 2018-07-13 (×2): 10 mg via ORAL
  Filled 2018-07-12 (×3): qty 2
  Filled 2018-07-12 (×2): qty 1
  Filled 2018-07-12: qty 2
  Filled 2018-07-12 (×2): qty 1

## 2018-07-12 MED ORDER — TRANEXAMIC ACID-NACL 1000-0.7 MG/100ML-% IV SOLN
1000.0000 mg | INTRAVENOUS | Status: AC
  Administered 2018-07-12: 1000 mg via INTRAVENOUS
  Filled 2018-07-12: qty 100

## 2018-07-12 MED ORDER — DIPHENHYDRAMINE HCL 12.5 MG/5ML PO ELIX: 12.5000 mg | ORAL_SOLUTION | ORAL | Status: DC | PRN

## 2018-07-12 MED ORDER — SODIUM CHLORIDE 0.9 % IV SOLN
INTRAVENOUS | Status: DC | PRN
  Administered 2018-07-12: 25 ug/min via INTRAVENOUS

## 2018-07-12 MED ORDER — SORBITOL 70 % SOLN
30.0000 mL | Freq: Every day | Status: DC | PRN
  Filled 2018-07-12: qty 30

## 2018-07-12 MED ORDER — ASPIRIN 81 MG PO CHEW
81.0000 mg | CHEWABLE_TABLET | Freq: Two times a day (BID) | ORAL | Status: DC
  Administered 2018-07-12 – 2018-07-13 (×2): 81 mg via ORAL
  Filled 2018-07-12 (×2): qty 1

## 2018-07-12 MED ORDER — ONDANSETRON HCL 4 MG PO TABS: 4.0000 mg | ORAL_TABLET | Freq: Four times a day (QID) | ORAL | Status: DC | PRN

## 2018-07-12 MED ORDER — ACETAMINOPHEN 500 MG PO TABS: 1000.0000 mg | ORAL_TABLET | Freq: Three times a day (TID) | ORAL | 0 refills | Status: AC

## 2018-07-12 MED ORDER — MONTELUKAST SODIUM 10 MG PO TABS
10.0000 mg | ORAL_TABLET | Freq: Every day | ORAL | Status: DC
  Administered 2018-07-13: 10 mg via ORAL
  Filled 2018-07-12: qty 1

## 2018-07-12 MED ORDER — ACETAMINOPHEN 500 MG PO TABS
1000.0000 mg | ORAL_TABLET | Freq: Once | ORAL | Status: AC
  Administered 2018-07-12: 1000 mg via ORAL
  Filled 2018-07-12: qty 2

## 2018-07-12 MED ORDER — EPHEDRINE SULFATE 50 MG/ML IJ SOLN
INTRAMUSCULAR | Status: DC | PRN
  Administered 2018-07-12 (×3): 10 mg via INTRAVENOUS
  Administered 2018-07-12: 5 mg via INTRAVENOUS

## 2018-07-12 MED ORDER — CELECOXIB 200 MG PO CAPS
200.0000 mg | ORAL_CAPSULE | Freq: Two times a day (BID) | ORAL | Status: DC
  Administered 2018-07-12 – 2018-07-13 (×2): 200 mg via ORAL
  Filled 2018-07-12 (×2): qty 1

## 2018-07-12 MED ORDER — BUPIVACAINE-EPINEPHRINE (PF) 0.5% -1:200000 IJ SOLN
INTRAMUSCULAR | Status: DC | PRN
  Administered 2018-07-12: 20 mL via PERINEURAL

## 2018-07-12 MED ORDER — CLONAZEPAM 0.5 MG PO TABS: 0.5000 mg | ORAL_TABLET | Freq: Two times a day (BID) | ORAL | Status: DC | PRN

## 2018-07-12 MED ORDER — DOCUSATE SODIUM 100 MG PO CAPS
100.0000 mg | ORAL_CAPSULE | Freq: Two times a day (BID) | ORAL | Status: DC
  Administered 2018-07-12 – 2018-07-13 (×2): 100 mg via ORAL
  Filled 2018-07-12 (×2): qty 1

## 2018-07-12 MED ORDER — SODIUM CHLORIDE (PF) 0.9 % IJ SOLN
INTRAMUSCULAR | Status: AC
  Filled 2018-07-12: qty 50

## 2018-07-12 MED ORDER — HYDROMORPHONE HCL 1 MG/ML IJ SOLN
0.2500 mg | INTRAMUSCULAR | Status: DC | PRN
  Administered 2018-07-12 (×2): 0.5 mg via INTRAVENOUS

## 2018-07-12 MED ORDER — GABAPENTIN 300 MG PO CAPS: 300.0000 mg | ORAL_CAPSULE | Freq: Three times a day (TID) | ORAL | 0 refills | Status: DC | PRN

## 2018-07-12 MED ORDER — DOCUSATE SODIUM 100 MG PO CAPS: 100.0000 mg | ORAL_CAPSULE | Freq: Two times a day (BID) | ORAL | 0 refills | Status: DC

## 2018-07-12 MED ORDER — CEFAZOLIN SODIUM-DEXTROSE 2-4 GM/100ML-% IV SOLN
2.0000 g | INTRAVENOUS | Status: AC
  Administered 2018-07-12: 2 g via INTRAVENOUS
  Filled 2018-07-12: qty 100

## 2018-07-12 MED ORDER — OXYCODONE HCL 5 MG PO TABS: 5.0000 mg | ORAL_TABLET | ORAL | 0 refills | Status: AC | PRN

## 2018-07-12 MED ORDER — BUPIVACAINE LIPOSOME 1.3 % IJ SUSP
INTRAMUSCULAR | Status: DC | PRN
  Administered 2018-07-12: 20 mL

## 2018-07-12 MED ORDER — AMLODIPINE BESYLATE 10 MG PO TABS
10.0000 mg | ORAL_TABLET | Freq: Every day | ORAL | Status: DC
  Administered 2018-07-13: 10 mg via ORAL
  Filled 2018-07-12: qty 1

## 2018-07-12 MED ORDER — CYCLOBENZAPRINE HCL 5 MG PO TABS
5.0000 mg | ORAL_TABLET | Freq: Three times a day (TID) | ORAL | Status: DC | PRN
  Administered 2018-07-12: 5 mg via ORAL
  Filled 2018-07-12: qty 1

## 2018-07-12 MED ORDER — PROMETHAZINE HCL 25 MG/ML IJ SOLN: 6.2500 mg | INTRAMUSCULAR | Status: DC | PRN

## 2018-07-12 MED ORDER — FENTANYL CITRATE (PF) 100 MCG/2ML IJ SOLN
INTRAMUSCULAR | Status: DC | PRN
  Administered 2018-07-12: 25 ug via INTRAVENOUS
  Administered 2018-07-12 (×2): 50 ug via INTRAVENOUS
  Administered 2018-07-12: 25 ug via INTRAVENOUS
  Administered 2018-07-12: 50 ug via INTRAVENOUS

## 2018-07-12 MED ORDER — ONDANSETRON HCL 4 MG/2ML IJ SOLN: 4.0000 mg | Freq: Four times a day (QID) | INTRAMUSCULAR | Status: DC | PRN

## 2018-07-12 MED ORDER — HYDROMORPHONE HCL 1 MG/ML IJ SOLN
INTRAMUSCULAR | Status: AC
  Administered 2018-07-12: 0.5 mg via INTRAVENOUS
  Filled 2018-07-12: qty 1

## 2018-07-12 MED ORDER — MAGNESIUM CITRATE PO SOLN: 1.0000 | Freq: Once | ORAL | Status: DC | PRN

## 2018-07-12 MED ORDER — 0.9 % SODIUM CHLORIDE (POUR BTL) OPTIME
TOPICAL | Status: DC | PRN
  Administered 2018-07-12: 1000 mL

## 2018-07-12 MED ORDER — LACTATED RINGERS IV SOLN
INTRAVENOUS | Status: DC
  Administered 2018-07-12 (×2): via INTRAVENOUS

## 2018-07-12 MED ORDER — DEXAMETHASONE SODIUM PHOSPHATE 10 MG/ML IJ SOLN
INTRAMUSCULAR | Status: DC | PRN
  Administered 2018-07-12: 10 mg via INTRAVENOUS

## 2018-07-12 MED ORDER — ONDANSETRON HCL 4 MG PO TABS: 4.0000 mg | ORAL_TABLET | Freq: Three times a day (TID) | ORAL | 0 refills | Status: DC | PRN

## 2018-07-12 MED ORDER — MIDAZOLAM HCL 2 MG/2ML IJ SOLN
INTRAMUSCULAR | Status: AC
  Filled 2018-07-12: qty 2

## 2018-07-12 MED ORDER — CARBOXYMETHYLCELLUL-GLYCERIN 0.5-0.9 % OP SOLN
1.0000 [drp] | Freq: Four times a day (QID) | OPHTHALMIC | Status: DC | PRN
  Filled 2018-07-12: qty 15

## 2018-07-12 MED ORDER — GABAPENTIN 300 MG PO CAPS
300.0000 mg | ORAL_CAPSULE | Freq: Three times a day (TID) | ORAL | Status: DC
  Administered 2018-07-12 – 2018-07-13 (×4): 300 mg via ORAL
  Filled 2018-07-12 (×4): qty 1

## 2018-07-12 MED ORDER — BUPIVACAINE HCL (PF) 0.75 % IJ SOLN
INTRAMUSCULAR | Status: DC | PRN
  Administered 2018-07-12: 1.6 mL via INTRATHECAL

## 2018-07-12 MED ORDER — DIPHENHYDRAMINE HCL 25 MG PO CAPS
25.0000 mg | ORAL_CAPSULE | Freq: Every evening | ORAL | Status: DC | PRN
  Administered 2018-07-13: 25 mg via ORAL
  Filled 2018-07-12 (×2): qty 1

## 2018-07-12 MED ORDER — HYDROCHLOROTHIAZIDE 25 MG PO TABS
25.0000 mg | ORAL_TABLET | Freq: Every day | ORAL | Status: DC
  Administered 2018-07-13: 25 mg via ORAL
  Filled 2018-07-12: qty 1

## 2018-07-12 MED ORDER — CEFAZOLIN SODIUM-DEXTROSE 2-4 GM/100ML-% IV SOLN
2.0000 g | Freq: Four times a day (QID) | INTRAVENOUS | Status: AC
  Administered 2018-07-12 (×2): 2 g via INTRAVENOUS
  Filled 2018-07-12 (×2): qty 100

## 2018-07-12 MED ORDER — ONDANSETRON HCL 4 MG/2ML IJ SOLN
INTRAMUSCULAR | Status: DC | PRN
  Administered 2018-07-12: 4 mg via INTRAVENOUS

## 2018-07-12 MED ORDER — SODIUM CHLORIDE 0.9% FLUSH
INTRAVENOUS | Status: DC | PRN
  Administered 2018-07-12: 30 mL

## 2018-07-12 MED ORDER — EPHEDRINE 5 MG/ML INJ
INTRAVENOUS | Status: AC
  Filled 2018-07-12: qty 10

## 2018-07-12 SURGICAL SUPPLY — 48 items
BLADE HEX COATED 2.75 (ELECTRODE) IMPLANT
BLADE SAG 18X100X1.27 (BLADE) ×2 IMPLANT
BLADE SAGITTAL 25.0X1.37X90 (BLADE) ×2 IMPLANT
BLADE SURG 15 STRL LF DISP TIS (BLADE) ×1 IMPLANT
BLADE SURG 15 STRL SS (BLADE) ×1
BLADE SURG SZ10 CARB STEEL (BLADE) ×4 IMPLANT
BNDG ELASTIC 6X10 VLCR STRL LF (GAUZE/BANDAGES/DRESSINGS) ×2 IMPLANT
BOWL SMART MIX CTS (DISPOSABLE) IMPLANT
CHLORAPREP W/TINT 26 (MISCELLANEOUS) ×4 IMPLANT
CLSR STERI-STRIP ANTIMIC 1/2X4 (GAUZE/BANDAGES/DRESSINGS) ×4 IMPLANT
COVER SURGICAL LIGHT HANDLE (MISCELLANEOUS) ×2 IMPLANT
COVER WAND RF STERILE (DRAPES) IMPLANT
CUFF TOURN SGL QUICK 34 (TOURNIQUET CUFF) ×1
CUFF TRNQT CYL 34X4.125X (TOURNIQUET CUFF) ×1 IMPLANT
DECANTER SPIKE VIAL GLASS SM (MISCELLANEOUS) IMPLANT
DRAPE U-SHAPE 47X51 STRL (DRAPES) ×2 IMPLANT
DRSG MEPILEX BORDER 4X12 (GAUZE/BANDAGES/DRESSINGS) ×2 IMPLANT
FEMORAL POSTERIOR SZ4 RT (Femur) ×1 IMPLANT
GLOVE BIO SURGEON STRL SZ7.5 (GLOVE) ×4 IMPLANT
GLOVE BIOGEL PI IND STRL 8 (GLOVE) ×2 IMPLANT
GLOVE BIOGEL PI INDICATOR 8 (GLOVE) ×2
GOWN STRL REUS W/ TWL LRG LVL3 (GOWN DISPOSABLE) ×2 IMPLANT
GOWN STRL REUS W/TWL LRG LVL3 (GOWN DISPOSABLE) ×2
HANDPIECE INTERPULSE COAX TIP (DISPOSABLE) ×1
HOLDER FOLEY CATH W/STRAP (MISCELLANEOUS) IMPLANT
IMMOBILIZER KNEE 20 (SOFTGOODS) ×2 IMPLANT
IMMOBILIZER KNEE 22 UNIV (SOFTGOODS) ×2 IMPLANT
INSERT TIB BEARING PS 5 9 (Miscellaneous) ×2 IMPLANT
KIT TURNOVER KIT A (KITS) IMPLANT
KNEE PATELLA ASYMMETRIC 9X29 (Knees) ×2 IMPLANT
KNEE TIBIAL COMPONENT SZ5 (Knees) ×2 IMPLANT
MANIFOLD NEPTUNE II (INSTRUMENTS) ×2 IMPLANT
NS IRRIG 1000ML POUR BTL (IV SOLUTION) ×2 IMPLANT
PACK TOTAL KNEE CUSTOM (KITS) ×2 IMPLANT
PIN FLUTED HEDLESS FIX 3.5X1/8 (Miscellaneous) ×2 IMPLANT
POSTERIOR FEMORAL SZ4 RT (Femur) ×2 IMPLANT
PROTECTOR NERVE ULNAR (MISCELLANEOUS) ×2 IMPLANT
SET HNDPC FAN SPRY TIP SCT (DISPOSABLE) ×1 IMPLANT
SUT MNCRL AB 4-0 PS2 18 (SUTURE) ×2 IMPLANT
SUT VIC AB 0 CT1 27 (SUTURE) ×1
SUT VIC AB 0 CT1 27XBRD ANBCTR (SUTURE) ×1 IMPLANT
SUT VIC AB 1 CT1 36 (SUTURE) ×6 IMPLANT
SUT VIC AB 2-0 CT1 27 (SUTURE) ×1
SUT VIC AB 2-0 CT1 TAPERPNT 27 (SUTURE) ×1 IMPLANT
TRAY FOLEY MTR SLVR 14FR STAT (SET/KITS/TRAYS/PACK) IMPLANT
TRAY FOLEY MTR SLVR 16FR STAT (SET/KITS/TRAYS/PACK) ×2 IMPLANT
WRAP KNEE MAXI GEL POST OP (GAUZE/BANDAGES/DRESSINGS) ×2 IMPLANT
YANKAUER SUCT BULB TIP 10FT TU (MISCELLANEOUS) ×2 IMPLANT

## 2018-07-12 NOTE — Anesthesia Procedure Notes (Signed)
Anesthesia Regional Block: Adductor canal block   Pre-Anesthetic Checklist: ,, timeout performed, Correct Patient, Correct Site, Correct Laterality, Correct Procedure, Correct Position, site marked, Risks and benefits discussed,  Surgical consent,  Pre-op evaluation,  At surgeon's request and post-op pain management  Laterality: Right  Prep: chloraprep       Needles:  Injection technique: Single-shot  Needle Type: Echogenic Needle     Needle Length: 9cm      Additional Needles:   Procedures:,,,, ultrasound used (permanent image in chart),,,,  Narrative:  Start time: 07/12/2018 6:50 AM End time: 07/12/2018 6:59 AM Injection made incrementally with aspirations every 5 mL.  Performed by: Personally  Anesthesiologist: Eilene Ghazi, MD  Additional Notes: Patient tolerated the procedure well without complications

## 2018-07-12 NOTE — Op Note (Signed)
DATE OF SURGERY:  07/12/2018 TIME: 9:37 AM  PATIENT NAME:  Danielle Anthony   AGE: 63 y.o.    PRE-OPERATIVE DIAGNOSIS:  OA RIGHT KNEE  POST-OPERATIVE DIAGNOSIS:  Same  PROCEDURE:  Procedure(s): TOTAL KNEE ARTHROPLASTY   SURGEON:  Sheral Apley, MD   ASSISTANT:  Aquilla Hacker, PA-C, he was present and scrubbed throughout the case, critical for completion in a timely fashion, and for retraction, instrumentation, and closure.    OPERATIVE IMPLANTS: Stryker Triathlon Posterior Stabilized. Press fit knee  Femur size 4, Tibia size 5, Patella size 29 3-peg oval button, with a 9 mm polyethylene insert.   PREOPERATIVE INDICATIONS:  Danielle Anthony is a 63 y.o. year old female with end stage bone on bone degenerative arthritis of the knee who failed conservative treatment, including injections, antiinflammatories, activity modification, and assistive devices, and had significant impairment of their activities of daily living, and elected for Total Knee Arthroplasty.   The risks, benefits, and alternatives were discussed at length including but not limited to the risks of infection, bleeding, nerve injury, stiffness, blood clots, the need for revision surgery, cardiopulmonary complications, among others, and they were willing to proceed.   OPERATIVE DESCRIPTION:  The patient was brought to the operative room and placed in a supine position.  General anesthesia was administered.  IV antibiotics were given.  The lower extremity was prepped and draped in the usual sterile fashion.  Time out was performed.  The leg was elevated and exsanguinated and the tourniquet was inflated.  Anterior approach was performed.  The patella was everted and osteophytes were removed.  The anterior horn of the medial and lateral meniscus was removed.   The distal femur was opened with the drill and the intramedullary distal femoral cutting jig was utilized, set at 5 degrees resecting 9 mm off the distal  femur.  Care was taken to protect the collateral ligaments.  The distal femoral sizing jig was applied, taking care to avoid notching.  Then the 4-in-1 cutting jig was applied and the anterior and posterior femur was cut, along with the chamfer cuts.  All posterior osteophytes were removed.  The flexion gap was then measured and was symmetric with the extension gap.  Then the extramedullary tibial cutting jig was utilized making the appropriate cut using the anterior tibial crest as a reference building in appropriate posterior slope.  Care was taken during the cut to protect the medial and collateral ligaments.  The proximal tibia was removed along with the posterior horns of the menisci.  The PCL was sacrificed.    The extensor gap was measured and was approximately 80mm.    I completed the distal femoral preparation using the appropriate jig to prepare the box.  The patella was then measured, and cut with the saw.    The proximal tibia sized and prepared accordingly with the reamer and the punch, and then all components were trialed with the above sized poly insert.  The knee was found to have excellent balance and full motion.    The above named components were then impacted into place and Poly tibial piece and patella were inserted.  I was very happy with his stability and ROM  I performed a periarticular injection with marcaine and toradol  The knee was easily taken through a range of motion and the patella tracked well and the knee irrigated copiously and the parapatellar and subcutaneous tissue closed with vicryl, and monocryl with steri strips for the skin.  The incision was dressed with sterile gauze and the tourniquet released and the patient was awakened and returned to the PACU in stable and satisfactory condition.  There were no complications.  Total tourniquet time was roughly 60 minutes.   POSTOPERATIVE PLAN: post op Abx, DVT px: SCD's, TED's, Early ambulation and chemical  px

## 2018-07-12 NOTE — Anesthesia Procedure Notes (Signed)
Anesthesia Procedure Image    

## 2018-07-12 NOTE — Anesthesia Postprocedure Evaluation (Signed)
Anesthesia Post Note  Patient: Danielle Anthony  Procedure(s) Performed: TOTAL KNEE ARTHROPLASTY (Right Knee)     Patient location during evaluation: PACU Anesthesia Type: General Level of consciousness: awake and alert Pain management: pain level controlled Vital Signs Assessment: post-procedure vital signs reviewed and stable Respiratory status: spontaneous breathing, nonlabored ventilation, respiratory function stable and patient connected to nasal cannula oxygen Cardiovascular status: blood pressure returned to baseline and stable Postop Assessment: no apparent nausea or vomiting Anesthetic complications: no    Last Vitals:  Vitals:   07/12/18 1100 07/12/18 1125  BP: 120/64 (!) 119/57  Pulse: 76 77  Resp: 14 16  Temp: 37 C 36.9 C  SpO2: 99% 100%    Last Pain:  Vitals:   07/12/18 1125  TempSrc: Oral  PainSc:                  [name]

## 2018-07-12 NOTE — Anesthesia Procedure Notes (Signed)
Procedure Name: LMA Insertion Date/Time: 07/12/2018 7:43 AM Performed by: Thornell Mule, CRNA Pre-anesthesia Checklist: Patient identified, Emergency Drugs available, Suction available and Patient being monitored Patient Re-evaluated:Patient Re-evaluated prior to induction Oxygen Delivery Method: Circle system utilized Preoxygenation: Pre-oxygenation with 100% oxygen Induction Type: IV induction Ventilation: Mask ventilation without difficulty LMA: LMA inserted LMA Size: 4.0 Number of attempts: 1 Placement Confirmation: positive ETCO2 Tube secured with: Tape Dental Injury: Teeth and Oropharynx as per pre-operative assessment

## 2018-07-12 NOTE — Interval H&P Note (Signed)
I participated in the care of this patient and agree with the above history, physical and evaluation. I performed a review of the history and a physical exam as detailed   Jhanvi Drakeford Daniel Armanii Pressnell MD  

## 2018-07-12 NOTE — Anesthesia Procedure Notes (Signed)
Date/Time: 07/12/2018 7:22 AM Performed by: Thornell Mule, CRNA Oxygen Delivery Method: Nasal cannula

## 2018-07-12 NOTE — Anesthesia Procedure Notes (Signed)
Spinal  Patient location during procedure: OR Start time: 07/12/2018 7:25 AM End time: 07/12/2018 7:35 AM Staffing Anesthesiologist: Myrtie Soman, MD Performed: anesthesiologist  Preanesthetic Checklist Completed: patient identified, site marked, surgical consent, pre-op evaluation, timeout performed, IV checked, risks and benefits discussed and monitors and equipment checked Spinal Block Patient position: sitting Prep: Betadine Patient monitoring: heart rate, continuous pulse ox and blood pressure Approach: midline Location: L3-4 Injection technique: single-shot Needle Needle type: Quincke  Needle gauge: 22 G Needle length: 9 cm Assessment Events: paresthesia and other event Additional Notes Expiration date of kit checked and confirmed. Patient tolerated procedure well, without complications.  Very poor flow, needle repositioned with paresthesia on L, again poor flow. Dose injected, GA as backup

## 2018-07-12 NOTE — Transfer of Care (Signed)
Immediate Anesthesia Transfer of Care Note  Patient: Danielle Anthony  Procedure(s) Performed: TOTAL KNEE ARTHROPLASTY (Right Knee)  Patient Location: PACU  Anesthesia Type:General  Level of Consciousness: drowsy, patient cooperative and responds to stimulation  Airway & Oxygen Therapy: Patient Spontanous Breathing and Patient connected to face mask oxygen  Post-op Assessment: Report given to RN and Post -op Vital signs reviewed and stable  Post vital signs: Reviewed and stable  Last Vitals:  Vitals Value Taken Time  BP 134/70 07/12/2018 10:04 AM  Temp    Pulse 82 07/12/2018 10:07 AM  Resp 14 07/12/2018 10:07 AM  SpO2 95 % 07/12/2018 10:07 AM  Vitals shown include unvalidated device data.  Last Pain:  Vitals:   07/12/18 0629  TempSrc:   PainSc: 2       Patients Stated Pain Goal: 4 (07/12/18 0175)  Complications: No apparent anesthesia complications

## 2018-07-12 NOTE — Plan of Care (Signed)

## 2018-07-12 NOTE — Evaluation (Signed)
Physical Therapy Evaluation Patient Details Name: Danielle Anthony MRN: [id number] DOB: [birth year] Today's Date: 07/12/2018   History of Present Illness  R TKA  Clinical Impression  The patient ambulated x 43' with RW. Patient tolerated well. Plans DC home with HHPT oper MD note. Pt admitted with above diagnosis. Pt currently with functional limitations due to the deficits listed below (see PT Problem List).  Pt will benefit from skilled PT to increase their independence and safety with mobility to allow discharge to the venue listed below.       Follow Up Recommendations Home health PT    Equipment Recommendations  None recommended by PT    Recommendations for Other Services       Precautions / Restrictions Precautions Precautions: Knee;Fall Required Braces or Orthoses: Knee Immobilizer - Right Knee Immobilizer - Right: On when out of bed or walking      Mobility  Bed Mobility Overal bed mobility: Needs Assistance Bed Mobility: Supine to Sit     Supine to sit: Min assist     General bed mobility comments: support  of right leg to floor,   Transfers Overall transfer level: Needs assistance Equipment used: Rolling walker (2 wheeled) Transfers: Sit to/from Stand Sit to Stand: Min assist         General transfer comment: cues for hand and right leg position  Ambulation/Gait Ambulation/Gait assistance: Min assist Gait Distance (Feet): 80 Feet Assistive device: Rolling walker (2 wheeled) Gait Pattern/deviations: Step-to pattern;Step-through pattern;Antalgic;Decreased step length - right;Decreased stance time - right     General Gait Details: cues for sequence and step length and posture  Stairs            Wheelchair Mobility    Modified Rankin (Stroke Patients Only)       Balance                                             Pertinent Vitals/Pain Pain Assessment: Faces Pain Score: 7  Pain Descriptors / Indicators:  Pressure;Aching Pain Intervention(s): Monitored during session;Repositioned;Premedicated before session;Ice applied    Home Living Family/patient expects to be discharged to:: Private residence Living Arrangements: Spouse/significant other Available Help at Discharge: Family Type of Home: House Home Access: Stairs to enter Entrance Stairs-Rails: None Entrance Stairs-Number of Steps: 3 + 1 Home Layout: One level Home Equipment: Environmental consultant - 2 wheels;Cane - single point;Crutches      Prior Function Level of Independence: Independent               Hand Dominance        Extremity/Trunk Assessment   Upper Extremity Assessment Upper Extremity Assessment: Defer to OT evaluation    Lower Extremity Assessment Lower Extremity Assessment: RLE deficits/detail RLE Deficits / Details: able to perform SLR    Cervical / Trunk Assessment Cervical / Trunk Assessment: Normal  Communication   Communication: No difficulties  Cognition Arousal/Alertness: Awake/alert Behavior During Therapy: WFL for tasks assessed/performed Overall Cognitive Status: Within Functional Limits for tasks assessed                                        General Comments      Exercises     Assessment/Plan    PT Assessment Patient needs continued PT services  PT  Problem List Decreased strength;Decreased range of motion;Decreased activity tolerance;Decreased knowledge of use of DME;Decreased mobility;Decreased knowledge of precautions;Decreased safety awareness;Pain       PT Treatment Interventions DME instruction;Therapeutic activities;Gait training;Therapeutic exercise;Stair training;Functional mobility training    PT Goals (Current goals can be found in the Care Plan section)  Acute Rehab PT Goals Patient Stated Goal: to go home, get other kne fixed PT Goal Formulation: With patient Time For Goal Achievement: 07/19/18 Potential to Achieve Goals: Good    Frequency 7X/week    Barriers to discharge        Co-evaluation               AM-PAC PT "6 Clicks" Mobility  Outcome Measure Help needed turning from your back to your side while in a flat bed without using bedrails?: A Little Help needed moving from lying on your back to sitting on the side of a flat bed without using bedrails?: A Little Help needed moving to and from a bed to a chair (including a wheelchair)?: A Little Help needed standing up from a chair using your arms (e.g., wheelchair or bedside chair)?: A Little Help needed to walk in hospital room?: A Little Help needed climbing 3-5 steps with a railing? : A Lot 6 Click Score: 17    End of Session Equipment Utilized During Treatment: Gait belt;Right knee immobilizer Activity Tolerance: Patient tolerated treatment well Patient left: in chair;with call bell/phone within reach;with chair alarm set Nurse Communication: Mobility status PT Visit Diagnosis: Unsteadiness on feet (R26.81);Pain Pain - Right/Left: Right Pain - part of body: Knee    Time: 3875-6433 PT Time Calculation (min) (ACUTE ONLY): 27 min   Charges:   PT Evaluation $PT Eval Low Complexity: 1 Low PT Treatments $Gait Training: 8-22 mins        Danielle Anthony PT Acute Rehabilitation Services Pager [phone] Office [phone]   Danielle Anthony 07/12/2018, 3:30 PM

## 2018-07-12 NOTE — Discharge Instructions (Signed)
You may bear weight as tolerated. °Keep your dressing on and dry until follow up. °Take medicine to prevent blood clots as directed. °Take pain medicine as needed with the goal of transitioning to over the counter medicines.  °If needed, you may increase breakthrough pain medication (oxycodone) for the first few days post op - up to 2 tablets every 4 hours.  Stop as this medication as soon as you are able. ° °INSTRUCTIONS AFTER JOINT REPLACEMENT  ° °o Remove items at home which could result in a fall. This includes throw rugs or furniture in walking pathways °o ICE to the affected joint every three hours while awake for 30 minutes at a time, for at least the first 3-5 days, and then as needed for pain and swelling.  Continue to use ice for pain and swelling. You may notice swelling that will progress down to the foot and ankle.  This is normal after surgery.  Elevate your leg when you are not up walking on it.   °o Continue to use the breathing machine you got in the hospital (incentive spirometer) which will help keep your temperature down.  It is common for your temperature to cycle up and down following surgery, especially at night when you are not up moving around and exerting yourself.  The breathing machine keeps your lungs expanded and your temperature down. ° ° °DIET:  As you were doing prior to hospitalization, we recommend a well-balanced diet. ° °DRESSING / WOUND CARE / SHOWERING ° °You may shower 3 days after surgery, but keep the wounds dry during showering.  You may use an occlusive plastic wrap (Press'n Seal for example) with blue painter's tape at edges, NO SOAKING/SUBMERGING IN THE BATHTUB.  If the bandage gets wet, change with a clean dry gauze.  If the incision gets wet, pat the wound dry with a clean towel. ° °ACTIVITY ° °o Increase activity slowly as tolerated, but follow the weight bearing instructions below.   °o No driving for 6 weeks or until further direction given by your physician.  You  cannot drive while taking narcotics.  °o No lifting or carrying greater than 10 lbs. until further directed by your surgeon. °o Avoid periods of inactivity such as sitting longer than an hour when not asleep. This helps prevent blood clots.  °o You may return to work once you are authorized by your doctor.  ° ° ° °WEIGHT BEARING  ° °Weight bearing as tolerated with assist device (walker, cane, etc) as directed, use it as long as suggested by your surgeon or therapist, typically at least 4-6 weeks. ° ° °EXERCISES ° °Results after joint replacement surgery are often greatly improved when you follow the exercise, range of motion and muscle strengthening exercises prescribed by your doctor. Safety measures are also important to protect the joint from further injury. Any time any of these exercises cause you to have increased pain or swelling, decrease what you are doing until you are comfortable again and then slowly increase them. If you have problems or questions, call your caregiver or physical therapist for advice.  ° °Rehabilitation is important following a joint replacement. After just a few days of immobilization, the muscles of the leg can become weakened and shrink (atrophy).  These exercises are designed to build up the tone and strength of the thigh and leg muscles and to improve motion. Often times heat used for twenty to thirty minutes before working out will loosen up your tissues and help   with improving the range of motion but do not use heat for the first two weeks following surgery (sometimes heat can increase post-operative swelling).  ° °These exercises can be done on a training (exercise) mat, on the floor, on a table or on a bed. Use whatever works the best and is most comfortable for you.    Use music or television while you are exercising so that the exercises are a pleasant break in your day. This will make your life better with the exercises acting as a break in your routine that you can look  forward to.   Perform all exercises about fifteen times, three times per day or as directed.  You should exercise both the operative leg and the other leg as well. ° °Exercises include: °  °• Quad Sets - Tighten up the muscle on the front of the thigh (Quad) and hold for 5-10 seconds.   °• Straight Leg Raises - With your knee straight (if you were given a brace, keep it on), lift the leg to 60 degrees, hold for 3 seconds, and slowly lower the leg.  Perform this exercise against resistance later as your leg gets stronger.  °• Leg Slides: Lying on your back, slowly slide your foot toward your buttocks, bending your knee up off the floor (only go as far as is comfortable). Then slowly slide your foot back down until your leg is flat on the floor again.  °• Angel Wings: Lying on your back spread your legs to the side as far apart as you can without causing discomfort.  °• Hamstring Strength:  Lying on your back, push your heel against the floor with your leg straight by tightening up the muscles of your buttocks.  Repeat, but this time bend your knee to a comfortable angle, and push your heel against the floor.  You may put a pillow under the heel to make it more comfortable if necessary.  ° °A rehabilitation program following joint replacement surgery can speed recovery and prevent re-injury in the future due to weakened muscles. Contact your doctor or a physical therapist for more information on knee rehabilitation.  ° ° °CONSTIPATION ° °Constipation is defined medically as fewer than three stools per week and severe constipation as less than one stool per week.  Even if you have a regular bowel pattern at home, your normal regimen is likely to be disrupted due to multiple reasons following surgery.  Combination of anesthesia, postoperative narcotics, change in appetite and fluid intake all can affect your bowels.  ° °YOU MUST use at least one of the following options; they are listed in order of increasing strength  to get the job done.  They are all available over the counter, and you may need to use some, POSSIBLY even all of these options:   ° °Drink plenty of fluids (prune juice may be helpful) and high fiber foods °Colace 100 mg by mouth twice a day  °Senokot for constipation as directed and as needed Dulcolax (bisacodyl), take with full glass of water  °Miralax (polyethylene glycol) once or twice a day as needed. ° °If you have tried all these things and are unable to have a bowel movement in the first 3-4 days after surgery call either your surgeon or your primary doctor.   ° °If you experience loose stools or diarrhea, hold the medications until you stool forms back up.  If your symptoms do not get better within 1 week or if they get   worse, check with your doctor.  If you experience "the worst abdominal pain ever" or develop nausea or vomiting, please contact the office immediately for further recommendations for treatment. ° ° °ITCHING:  If you experience itching with your medications, try taking only a single pain pill, or even half a pain pill at a time.  You can also use Benadryl over the counter for itching or also to help with sleep.  ° °TED HOSE STOCKINGS:  Use stockings on both legs until for at least 2 weeks or as directed by physician office. They may be removed at night for sleeping. ° °MEDICATIONS:  See your medication summary on the “After Visit Summary” that nursing will review with you.  You may have some home medications which will be placed on hold until you complete the course of blood thinner medication.  It is important for you to complete the blood thinner medication as prescribed. ° °PRECAUTIONS:  If you experience chest pain or shortness of breath - call 911 immediately for transfer to the hospital emergency department.  ° °If you develop a fever greater that 101 F, purulent drainage from wound, increased redness or drainage from wound, foul odor from the wound/dressing, or calf pain - CONTACT  YOUR SURGEON.   °                                                °FOLLOW-UP APPOINTMENTS:  If you do not already have a post-op appointment, please call the office for an appointment to be seen by your surgeon.  Guidelines for how soon to be seen are listed in your “After Visit Summary”, but are typically between 1-4 weeks after surgery. ° °OTHER INSTRUCTIONS:  ° ° ° °MAKE SURE YOU:  °• Understand these instructions.  °• Get help right away if you are not doing well or get worse.  ° ° °Thank you for letting us be a part of your medical care team.  It is a privilege we respect greatly.  We hope these instructions will help you stay on track for a fast and full recovery!  ° ° ° ° °

## 2018-07-12 NOTE — Anesthesia Preprocedure Evaluation (Addendum)
Anesthesia Evaluation  Patient identified by MRN, date of birth, ID band Patient awake    Reviewed: Allergy & Precautions, NPO status , Patient's Chart, lab work & pertinent test results  Airway Mallampati: II  TM Distance: >3 FB Neck ROM: Full    Dental no notable dental hx.    Pulmonary neg pulmonary ROS,    Pulmonary exam normal breath sounds clear to auscultation       Cardiovascular hypertension, Normal cardiovascular exam Rhythm:Regular Rate:Normal     Neuro/Psych negative neurological ROS  negative psych ROS   GI/Hepatic Neg liver ROS, GERD  ,  Endo/Other  negative endocrine ROS  Renal/GU negative Renal ROS  negative genitourinary   Musculoskeletal  (+) Arthritis , Osteoarthritis,    Abdominal   Peds negative pediatric ROS (+)  Hematology negative hematology ROS (+)   Anesthesia Other Findings   Reproductive/Obstetrics negative OB ROS                             Anesthesia Physical Anesthesia Plan  ASA: II  Anesthesia Plan: Spinal   Post-op Pain Management:  Regional for Post-op pain   Induction: Intravenous  PONV Risk Score and Plan: 2 and Ondansetron and Dexamethasone  Airway Management Planned: Simple Face Mask  Additional Equipment:   Intra-op Plan:   Post-operative Plan:   Informed Consent: I have reviewed the patients History and Physical, chart, labs and discussed the procedure including the risks, benefits and alternatives for the proposed anesthesia with the patient or authorized representative who has indicated his/her understanding and acceptance.     Dental advisory given  Plan Discussed with: CRNA and Surgeon  Anesthesia Plan Comments:         Anesthesia Quick Evaluation

## 2018-07-13 DIAGNOSIS — K5909 Other constipation: Secondary | ICD-10-CM | POA: Diagnosis not present

## 2018-07-13 DIAGNOSIS — Z791 Long term (current) use of non-steroidal anti-inflammatories (NSAID): Secondary | ICD-10-CM | POA: Diagnosis not present

## 2018-07-13 DIAGNOSIS — K219 Gastro-esophageal reflux disease without esophagitis: Secondary | ICD-10-CM | POA: Diagnosis not present

## 2018-07-13 DIAGNOSIS — Z888 Allergy status to other drugs, medicaments and biological substances status: Secondary | ICD-10-CM | POA: Diagnosis not present

## 2018-07-13 DIAGNOSIS — Z79899 Other long term (current) drug therapy: Secondary | ICD-10-CM | POA: Diagnosis not present

## 2018-07-13 DIAGNOSIS — Z8249 Family history of ischemic heart disease and other diseases of the circulatory system: Secondary | ICD-10-CM | POA: Diagnosis not present

## 2018-07-13 DIAGNOSIS — I10 Essential (primary) hypertension: Secondary | ICD-10-CM | POA: Diagnosis not present

## 2018-07-13 DIAGNOSIS — F419 Anxiety disorder, unspecified: Secondary | ICD-10-CM | POA: Diagnosis not present

## 2018-07-13 DIAGNOSIS — F329 Major depressive disorder, single episode, unspecified: Secondary | ICD-10-CM | POA: Diagnosis not present

## 2018-07-13 DIAGNOSIS — M1711 Unilateral primary osteoarthritis, right knee: Secondary | ICD-10-CM | POA: Diagnosis not present

## 2018-07-13 MED ORDER — GABAPENTIN 300 MG PO CAPS: 300.0000 mg | ORAL_CAPSULE | Freq: Three times a day (TID) | ORAL | 0 refills | Status: DC | PRN

## 2018-07-13 NOTE — Evaluation (Signed)
Occupational Therapy Evaluation Patient Details Name: Danielle Anthony MRN: [id number] DOB: [birth year] Today's Date: 07/13/2018    History of Present Illness R TKA   Clinical Impression   This 63 year old female was admitted for the above.  At baseline, she is independent and husband will assist her at home.  Worked through ADL from [facility].  Pt has a tendency to release both hands in standing, and she is not stable enough to do this without someone helping with balance. She is very independent natured. She verbalizes understanding of this.  No further OT is needed at this time    Follow Up Recommendations  Supervision/Assistance - 24 hour    Equipment Recommendations  None recommended by OT(has 3;1)    Recommendations for Other Services       Precautions / Restrictions Precautions Precautions: Knee;Fall Required Braces or Orthoses: Knee Immobilizer - Right Knee Immobilizer - Right: On when out of bed or walking Restrictions Weight Bearing Restrictions: No      Mobility Bed Mobility         Supine to sit: Min guard     General bed mobility comments: from mostly flat bed; KI on. Showed gait belt for back to bed  Transfers   Equipment used: Rolling walker (2 wheeled)   Sit to Stand: Min assist         General transfer comment: steadying assist and cues for UE/LE placement    Balance                                           ADL either performed or assessed with clinical judgement   ADL Overall ADL's : Needs assistance/impaired     Grooming: Supervision/safety;Standing   Upper Body Bathing: Set up   Lower Body Bathing: Minimal assistance   Upper Body Dressing : Set up   Lower Body Dressing: Maximal assistance;Sit to/from stand   Toilet Transfer: Minimal assistance;Ambulation;BSC;RW   Toileting- Clothing Manipulation and Hygiene: Minimal assistance;Sit to/from stand         General ADL Comments: performed ADL from [facility] over  toilet.  Pt will sponge bathe at home     Vision         Perception     Praxis      Pertinent Vitals/Pain Pain Assessment: Faces Faces Pain Scale: Hurts little more Pain Location: R knee Pain Descriptors / Indicators: Aching Pain Intervention(s): Limited activity within patient's tolerance;Monitored during session;Premedicated before session;Repositioned;Ice applied     Hand Dominance     Extremity/Trunk Assessment Upper Extremity Assessment Upper Extremity Assessment: Overall WFL for tasks assessed           Communication Communication Communication: No difficulties   Cognition Arousal/Alertness: Awake/alert Behavior During Therapy: WFL for tasks assessed/performed Overall Cognitive Status: Within Functional Limits for tasks assessed                                     General Comments       Exercises     Shoulder Instructions      Home Living Family/patient expects to be discharged to:: Private residence Living Arrangements: Spouse/significant other Available Help at Discharge: Family               Bathroom Shower/Tub: Chief Strategy Officer: Standard  Home Equipment: Bedside commode          Prior Functioning/Environment Level of Independence: Independent                 OT Problem List:        OT Treatment/Interventions:      OT Goals(Current goals can be found in the care plan section) Acute Rehab OT Goals Patient Stated Goal: to go home, get other kne fixed OT Goal Formulation: All assessment and education complete, DC therapy  OT Frequency:     Barriers to D/C:            Co-evaluation              AM-PAC OT "6 Clicks" Daily Activity     Outcome Measure Help from another person eating meals?: None Help from another person taking care of personal grooming?: A Little Help from another person toileting, which includes using toliet, bedpan, or urinal?: A Little Help from another  person bathing (including washing, rinsing, drying)?: A Little Help from another person to put on and taking off regular upper body clothing?: A Little Help from another person to put on and taking off regular lower body clothing?: A Lot 6 Click Score: 18   End of Session    Activity Tolerance: Patient tolerated treatment well Patient left: in chair;with call bell/phone within reach;with nursing/sitter in room  OT Visit Diagnosis: Pain Pain - Right/Left: Right Pain - part of body: Knee                Time: 6811-5726 OT Time Calculation (min): 34 min Charges:  OT General Charges $OT Visit: 1 Visit OT Evaluation $OT Eval Low Complexity: 1 Low OT Treatments $Self Care/Home Management : 8-22 mins  Marica Otter, OTR/L Acute Rehabilitation Services [phone] [facility] pager [phone] office 07/13/2018  Kareem Cathey 07/13/2018, 9:14 AM

## 2018-07-13 NOTE — Progress Notes (Signed)
Physical Therapy Treatment Patient Details Name: Danielle Anthony MRN: [id number] DOB: Oct 31, 1955 Today's Date: 07/13/2018    History of Present Illness R TKA    PT Comments    Pt tolerated session this morning for exercises and ambulation. Will complete stair training after lunch today prior to DC home.    Follow Up Recommendations  Follow surgeon's recommendation for DC plan and follow-up therapies(per MD note chart states Home [facility] with [facility] )     Equipment Recommendations  None recommended by PT    Recommendations for Other Services       Precautions / Restrictions Precautions Precautions: Knee;Fall Required Braces or Orthoses: Knee Immobilizer - Right Knee Immobilizer - Right: On when out of bed or walking Restrictions Weight Bearing Restrictions: No    Mobility  Bed Mobility Overal bed mobility: Needs Assistance Bed Mobility: Supine to Sit     Supine to sit: Min assist     General bed mobility comments: support  of right leg to floor,   Transfers Overall transfer level: Needs assistance Equipment used: Rolling walker (2 wheeled) Transfers: Sit to/from Stand Sit to Stand: Min assist         General transfer comment: cues for hand and right leg position  Ambulation/Gait Ambulation/Gait assistance: Min assist Gait Distance (Feet): 120 Feet Assistive device: Rolling walker (2 wheeled) Gait Pattern/deviations: Step-to pattern;Antalgic;Decreased step length - right;Decreased stance time - right     General Gait Details: cues for sequence and step length and posture ( to not get too close tot he rW for balance issues)    Stairs             Wheelchair Mobility    Modified Rankin (Stroke Patients Only)       Balance Overall balance assessment: Needs assistance                                          Cognition Arousal/Alertness: Awake/alert Behavior During Therapy: WFL for tasks assessed/performed Overall  Cognitive Status: Within Functional Limits for tasks assessed                                        Exercises Total Joint Exercises Ankle Circles/Pumps: AROM;Right;Supine;10 reps Quad Sets: AROM;10 reps;Right;Supine Heel Slides: AAROM;10 reps;Right;Supine Hip ABduction/ADduction: AAROM;10 reps;Right;Supine Straight Leg Raises: AAROM;10 reps;Right;Supine Goniometric ROM: 0-50 gross measurement supine     General Comments        Pertinent Vitals/Pain Pain Assessment: Faces Pain Score: 5  Faces Pain Scale: Hurts little more Pain Location: R knee esepcially in the back of the knee with teh bone foam per pateint  Pain Descriptors / Indicators: Aching Pain Intervention(s): Monitored during session;Ice applied    Home Living Family/patient expects to be discharged to:: Private residence Living Arrangements: Spouse/significant other Available Help at Discharge: Family         Home Equipment: Bedside commode      Prior Function Level of Independence: Independent          PT Goals (current goals can now be found in the care plan section) Acute Rehab PT Goals Patient Stated Goal: to go home, get other kne fixed PT Goal Formulation: With patient Time For Goal Achievement: 07/19/18 Potential to Achieve Goals: Good Progress towards PT goals: Progressing toward goals  Frequency    7X/week      PT Plan      Co-evaluation              AM-PAC PT "6 Clicks" Mobility   Outcome Measure  Help needed turning from your back to your side while in a flat bed without using bedrails?: A Little Help needed moving from lying on your back to sitting on the side of a flat bed without using bedrails?: A Little Help needed moving to and from a bed to a chair (including a wheelchair)?: A Little Help needed standing up from a chair using your arms (e.g., wheelchair or bedside chair)?: A Little Help needed to walk in hospital room?: A Little Help needed  climbing 3-5 steps with a railing? : A Lot 6 Click Score: 17    End of Session Equipment Utilized During Treatment: Gait belt;Right knee immobilizer Activity Tolerance: Patient tolerated treatment well Patient left: in chair;with call bell/phone within reach;with chair alarm set Nurse Communication: Mobility status PT Visit Diagnosis: Unsteadiness on feet (R26.81);Pain Pain - Right/Left: Right Pain - part of body: Knee     Time: 6256-3893 PT Time Calculation (min) (ACUTE ONLY): 35 min  Charges:  $Gait Training: 8-22 mins $Therapeutic Exercise: 8-22 mins                     Marella Bile, PT Acute Rehabilitation Services Pager: [phone] Office: [phone] 07/13/2018    Marella Bile 07/13/2018, 12:11 PM

## 2018-07-13 NOTE — TOC Transition Note (Signed)
Transition of Care [facility]) - CM/SW Discharge Note   Patient Details  Name: Danielle Anthony MRN: [id number] Date of Birth: [birth year]  Transition of Care [facility]) CM/SW Contact:  Golda Acre, RN Phone Number: 07/13/2018, 10:30 AM   Clinical Narrative:    Discharge to home with dme, has good support system in place.   Final next level of care: Home/Self Care Barriers to Discharge: No Barriers Identified   Patient Goals and CMS Choice Patient states their goals for this hospitalization and ongoing recovery are:: to go home and get better CMS Medicare.gov Compare Post Acute Care list provided to:: Patient Choice offered to / list presented to : Patient  Discharge Placement                       Discharge Plan and Services Discharge Planning Services: CM Consult Post Acute Care Choice: Durable Medical Equipment          DME Arranged: 3-N-1, Walker rolling DME Agency: [facility] HH Arranged: PT HH Agency: [facility]   Social Determinants of Health (SDOH) Interventions     Readmission Risk Interventions No flowsheet data found.

## 2018-07-13 NOTE — Progress Notes (Signed)
    Subjective: Patient reports pain as mild to moderate.  Tolerating diet.  Urinating.  No CP, SOB.   Walked hallway with therapy.  Objective:   VITALS:   Vitals:   07/12/18 1429 07/12/18 2200 07/13/18 0057 07/13/18 0533  BP: 135/62 134/66 (!) 144/67 123/61  Pulse: 76 79 79 68  Resp: 16 16 14 16   Temp: 97.9 F (36.6 C) 97.9 F (36.6 C) 98.4 F (36.9 C) 98.7 F (37.1 C)  TempSrc:  Oral Oral Oral  SpO2: 100% 98% 100% 100%  Weight:      Height:       CBC Latest Ref Rng & Units 07/05/2018 12/25/2014 04/22/2014  WBC 4.0 - 10.5 K/uL 6.1 5.5 4.7  Hemoglobin 12.0 - 15.0 g/dL 78.5 88.5 02.7  Hematocrit 36.0 - 46.0 % 42.5 41.8 39.6  Platelets 150 - 400 K/uL 390 - -   BMP Latest Ref Rng & Units 07/05/2018 12/25/2014 08/15/2011  Glucose 70 - 99 mg/dL 741(O) 93 97  BUN 8 - 23 mg/dL 16 17 12   Creatinine 0.44 - 1.00 mg/dL 8.78 6.76 7.20  Sodium 135 - 145 mmol/L 139 142 140  Potassium 3.5 - 5.1 mmol/L 3.2(L) 4.0 4.0  Chloride 98 - 111 mmol/L 102 99 101  CO2 22 - 32 mmol/L 28 28 32  Calcium 8.9 - 10.3 mg/dL 94.7 09.6 9.8   Intake/Output      03/10 0701 - 03/11 0700 03/11 0701 - 03/12 0700   P.O. 0    I.V. (mL/kg) 2475.8 (27.1)    IV Piggyback 350    Total Intake(mL/kg) 2825.8 (31)    Urine (mL/kg/hr) 925 (0.4)    Stool 0    Blood 25    Total Output 950    Net +1875.8         Stool Occurrence 0 x       Physical Exam: General: NAD.  Upright in bed.  Calm, conversant. Resp: No increased wob Cardio: regular rate and rhythm ABD soft Neurologically intact MSK RLE: Neurovascularly intact Sensation intact distally Feet warm Dorsiflexion/Plantar flexion intact Incision: dressing C/D/I   Assessment: 1 Day Post-Op  S/P Procedure(s) (LRB): TOTAL KNEE ARTHROPLASTY (Right) by Dr. Jewel Baize. Eulah Pont on 07/12/2018  Principal Problem:   Primary osteoarthritis of right knee Active Problems:   HTN (hypertension)   Primary localized osteoarthritis of knee   Primary  osteoarthritis, status post right total knee arthroplasty Doing well postop day 1 Eating, drinking, and voiding Pain controlled Good early mobilization  Plan: Up with therapy Incentive Spirometry Elevate and Apply ice CPM, bone foam  Weight Bearing: Weight Bearing as Tolerated (WBAT)  Dressings: Maintain Mepilex.   VTE prophylaxis: Aspirin, SCDs, ambulation Dispo: Home today after therapy    Patient's anticipated LOS is less than 2 midnights, meeting these requirements: - Younger than 68 - Lives within 1 hour of care - Has a competent adult at home to recover with post-op recover - NO history of  - Chronic pain requiring opiods  - Diabetes  - Coronary Artery Disease  - Heart failure  - Heart attack  - Stroke  - DVT/VTE  - Cardiac arrhythmia  - Respiratory Failure/COPD  - Renal failure  - Anemia  - Advanced Liver disease  Albina Billet III, PA-C 07/13/2018, 7:16 AM

## 2018-07-13 NOTE — Progress Notes (Signed)
PT TX note  07/13/18 1400  PT Visit Information  Last PT Received On 07/13/18 Ready for d/c from PT standpoint; recommend assist/supervision for OOB for safety at home d/t pt unsteady gait and occasional episodes of dizziness--discussed with pt  Assistance Needed +1  History of Present Illness R TKA  Subjective Data  Patient Stated Goal to go home, get other kne fixed  Precautions  Precautions Knee;Fall  Required Braces or Orthoses Knee Immobilizer - Right  Knee Immobilizer - Right On when out of bed or walking  Restrictions  Weight Bearing Restrictions No  Pain Assessment  Pain Assessment 0-10  Pain Score 3  Pain Location R knee esepcially in the back of the knee with teh bone foam per pateint   Pain Descriptors / Indicators Aching  Pain Intervention(s) Limited activity within patient's tolerance;Monitored during session;Repositioned  Cognition  Arousal/Alertness Awake/alert  Behavior During Therapy WFL for tasks assessed/performed  Overall Cognitive Status Within Functional Limits for tasks assessed  Bed Mobility  Overal bed mobility Needs Assistance  Bed Mobility Sit to Supine  Sit to supine Min assist  General bed mobility comments assist with RTKA  Transfers  Overall transfer level Needs assistance  Equipment used Rolling walker (2 wheeled)  Transfers Sit to/from Stand  Sit to Stand Min guard  General transfer comment cues for hand and right leg position  Ambulation/Gait  Ambulation/Gait assistance Min assist;Min guard  Gait Distance (Feet) 40 Feet  Assistive device Rolling walker (2 wheeled)  Gait Pattern/deviations Step-to pattern;Antalgic;Decreased step length - right;Decreased stance time - right  General Gait Details cues for step length and RW position  Stairs Yes  Stairs assistance Min assist  Stair Management Step to pattern;With walker;Backwards  Number of Stairs 3  General stair comments x2; assist for balance, cues for technique and sequence  PT - End of  Session  Equipment Utilized During Treatment Gait belt;Right knee immobilizer  Activity Tolerance Patient tolerated treatment well  Patient left in bed;with call bell/phone within reach  Nurse Communication Mobility status   PT - Assessment/Plan  PT Plan Current plan remains appropriate  PT Visit Diagnosis Unsteadiness on feet (R26.81);Pain  Pain - Right/Left Right  Pain - part of body Knee  PT Frequency (ACUTE ONLY) 7X/week  Follow Up Recommendations Follow surgeon's recommendation for DC plan and follow-up therapies;Supervision for mobility/OOB  PT equipment None recommended by PT  AM-PAC PT "6 Clicks" Mobility Outcome Measure (Version 2)  Help needed turning from your back to your side while in a flat bed without using bedrails? 3  Help needed moving from lying on your back to sitting on the side of a flat bed without using bedrails? 3  Help needed moving to and from a bed to a chair (including a wheelchair)? 3  Help needed standing up from a chair using your arms (e.g., wheelchair or bedside chair)? 3  Help needed to walk in hospital room? 3  Help needed climbing 3-5 steps with a railing?  3  6 Click Score 18  Consider Recommendation of Discharge To: Home with [facility]  PT Goal Progression  Progress towards PT goals Progressing toward goals  Acute Rehab PT Goals  PT Goal Formulation With patient  Time For Goal Achievement 07/19/18  Potential to Achieve Goals Good  PT Time Calculation  PT Start Time (ACUTE ONLY) 1344  PT Stop Time (ACUTE ONLY) 1358  PT Time Calculation (min) (ACUTE ONLY) 14 min  PT General Charges  $$ ACUTE PT VISIT 1 Visit  PT Treatments  $Gait Training 8-22 mins

## 2018-07-13 NOTE — Discharge Summary (Addendum)
Discharge Summary  Patient ID: Danielle Anthony MRN: [id number] DOB/AGE: 01/17/1956 63 y.o.  Admit date: 07/12/2018 Discharge date: 07/13/2018  Admission Diagnoses:  Primary osteoarthritis of right knee  Discharge Diagnoses:  Principal Problem:   Primary osteoarthritis of right knee Active Problems:   HTN (hypertension)   Primary localized osteoarthritis of knee   Past Medical History:  Diagnosis Date  . Anxiety   . Chronic constipation   . Complication of anesthesia   . Depression   . GERD (gastroesophageal reflux disease)   . Hypertension     Surgeries: Procedure(s): TOTAL KNEE ARTHROPLASTY on 07/12/2018   Consultants (if any):   Discharged Condition: Improved  Hospital Course: Danielle Anthony is an 63 y.o. female who was admitted 07/12/2018 with a diagnosis of Primary osteoarthritis of right knee and went to the operating room on 07/12/2018 and underwent the above named procedures.    She was given perioperative antibiotics:  Anti-infectives (From admission, onward)   Start     Dose/Rate Route Frequency Ordered Stop   07/12/18 1400  ceFAZolin (ANCEF) IVPB 2g/100 mL premix     2 g 200 mL/hr over 30 Minutes Intravenous Every 6 hours 07/12/18 1138 07/12/18 2031   07/12/18 0600  ceFAZolin (ANCEF) IVPB 2g/100 mL premix     2 g 200 mL/hr over 30 Minutes Intravenous On call to O.R. 07/12/18 0532 07/12/18 0758    .  She was given sequential compression devices, early ambulation, and aspirin for DVT prophylaxis.  She benefited maximally from the hospital stay and there were no complications.    Recent vital signs:  Vitals:   07/13/18 0925 07/13/18 0956  BP: 139/74 (!) 145/80  Pulse: 70 75  Resp: 16 16  Temp: 98.4 F (36.9 C) 98.2 F (36.8 C)  SpO2: 97% 97%    Recent laboratory studies:  Lab Results  Component Value Date   HGB 13.5 07/05/2018   HGB 13.2 12/25/2014   HGB 13.0 04/22/2014   Lab Results  Component Value Date   WBC 6.1 07/05/2018   PLT 390  07/05/2018   No results found for: INR Lab Results  Component Value Date   NA 139 07/05/2018   K 3.2 (L) 07/05/2018   CL 102 07/05/2018   CO2 28 07/05/2018   BUN 16 07/05/2018   CREATININE 0.79 07/05/2018   GLUCOSE 100 (H) 07/05/2018    Discharge Medications:   Allergies as of 07/13/2018      Reactions   Blood-group Specific Substance    No blood products   Lisinopril Swelling      Medication List    STOP taking these medications   diclofenac sodium 1 % Gel Commonly known as:  VOLTAREN   ibuprofen 200 MG tablet Commonly known as:  ADVIL,MOTRIN     TAKE these medications   acetaminophen 500 MG tablet Commonly known as:  TYLENOL Take 2 tablets (1,000 mg total) by mouth every 8 (eight) hours for 10 days. For Pain.   albuterol 108 (90 Base) MCG/ACT inhaler Commonly known as:  PROVENTIL HFA;VENTOLIN HFA Inhale 2 puffs into the lungs every 6 (six) hours as needed for wheezing or shortness of breath.   amLODipine 10 MG tablet Commonly known as:  NORVASC TAKE 1 TABLET (10 MG TOTAL) BY MOUTH DAILY. What changed:  See the new instructions.   aspirin EC 81 MG tablet Take 1 tablet (81 mg total) by mouth 2 (two) times daily. For DVT prophylaxis for 30 days after surgery.  celecoxib 200 MG capsule Commonly known as:  CeleBREX Take 1 capsule (200 mg total) by mouth 2 (two) times daily for 14 days. For 2 weeks post op for pain and inflammation.  Discontinue Ibuprofen when taking this medicine.   citalopram 20 MG tablet Commonly known as:  CELEXA Take 20 mg by mouth daily.   clonazePAM 0.5 MG tablet Commonly known as:  KLONOPIN Take 0.5 mg by mouth 2 (two) times daily as needed for anxiety.   cyclobenzaprine 5 MG tablet Commonly known as:  FLEXERIL Take 5 mg by mouth 3 (three) times daily as needed for muscle spasms.   cycloSPORINE 0.05 % ophthalmic emulsion Commonly known as:  RESTASIS Place 1 drop into both eyes daily.   docusate sodium 100 MG capsule Commonly  known as:  Colace Take 1 capsule (100 mg total) by mouth 2 (two) times daily. To prevent constipation while taking pain medication.   EQ Allergy Relief 25 MG tablet Generic drug:  diphenhydrAMINE Take 25 mg by mouth at bedtime.   gabapentin 300 MG capsule Commonly known as:  Neurontin Take 1 capsule (300 mg total) by mouth 3 (three) times daily as needed for up to 14 days. For 2 weeks post op for pain.   hydrochlorothiazide 25 MG tablet Commonly known as:  HYDRODIURIL TAKE 1 TABLET (25 MG TOTAL) BY MOUTH DAILY. What changed:  See the new instructions.   linaclotide 145 MCG Caps capsule Commonly known as:  LINZESS Take 145 mcg by mouth every other day. In the morning.   montelukast 10 MG tablet Commonly known as:  SINGULAIR Take 10 mg by mouth daily.   omeprazole 20 MG capsule Commonly known as:  PriLOSEC Take 1 capsule (20 mg total) by mouth daily. 30 days for gastroprotection while taking NSAIDs.   ondansetron 4 MG tablet Commonly known as:  Zofran Take 1 tablet (4 mg total) by mouth every 8 (eight) hours as needed for nausea or vomiting.   oxyCODONE 5 MG immediate release tablet Commonly known as:  Roxicodone Take 1 tablet (5 mg total) by mouth every 4 (four) hours as needed for up to 30 days for breakthrough pain.   PROBIOTIC PO Take 2 capsules by mouth daily.   Theratears 0.25 % Soln Generic drug:  Carboxymethylcellulose Sodium Place 1-2 drops into both eyes every 2 (two) hours as needed (for dry/irritated eyes.).       Diagnostic Studies: Dg Knee Right Port  Result Date: 07/12/2018 CLINICAL DATA:  63 year old female post right knee replacement. Initial encounter. EXAM: PORTABLE RIGHT KNEE - 1-2 VIEW COMPARISON:  None. FINDINGS: Post right knee replacement which appears in satisfactory position without complication noted. IMPRESSION: Post right knee replacement. Electronically Signed   By: Lacy Duverney M.D.   On: 07/12/2018 10:34    Disposition: Discharge  disposition: 01-Home or Self Care       Discharge Instructions    Discharge patient   Complete by:  As directed    Discharge disposition:  01-Home or Self Care   Discharge patient date:  07/13/2018      Follow-up Information    Sheral Apley, MD.   Specialty:  Orthopedic Surgery Contact information: [address] [place] [address] [phone]            Signed: Albina Billet III PA-C 07/13/2018, 1:03 PM

## 2018-07-14 DIAGNOSIS — M1711 Unilateral primary osteoarthritis, right knee: Secondary | ICD-10-CM | POA: Diagnosis not present

## 2018-07-14 DIAGNOSIS — Z96651 Presence of right artificial knee joint: Secondary | ICD-10-CM | POA: Diagnosis not present

## 2018-07-15 ENCOUNTER — Encounter (HOSPITAL_COMMUNITY): Payer: Self-pay | Admitting: Orthopedic Surgery

## 2018-07-15 DIAGNOSIS — M170 Bilateral primary osteoarthritis of knee: Secondary | ICD-10-CM | POA: Diagnosis not present

## 2018-07-18 DIAGNOSIS — Z9181 History of falling: Secondary | ICD-10-CM | POA: Diagnosis not present

## 2018-07-18 DIAGNOSIS — K5909 Other constipation: Secondary | ICD-10-CM | POA: Diagnosis not present

## 2018-07-18 DIAGNOSIS — Z7982 Long term (current) use of aspirin: Secondary | ICD-10-CM | POA: Diagnosis not present

## 2018-07-18 DIAGNOSIS — E669 Obesity, unspecified: Secondary | ICD-10-CM | POA: Diagnosis not present

## 2018-07-18 DIAGNOSIS — K219 Gastro-esophageal reflux disease without esophagitis: Secondary | ICD-10-CM | POA: Diagnosis not present

## 2018-07-18 DIAGNOSIS — F419 Anxiety disorder, unspecified: Secondary | ICD-10-CM | POA: Diagnosis not present

## 2018-07-18 DIAGNOSIS — Z96651 Presence of right artificial knee joint: Secondary | ICD-10-CM | POA: Diagnosis not present

## 2018-07-18 DIAGNOSIS — Z6833 Body mass index (BMI) 33.0-33.9, adult: Secondary | ICD-10-CM | POA: Diagnosis not present

## 2018-07-18 DIAGNOSIS — Z471 Aftercare following joint replacement surgery: Secondary | ICD-10-CM | POA: Diagnosis not present

## 2018-07-18 DIAGNOSIS — I10 Essential (primary) hypertension: Secondary | ICD-10-CM | POA: Diagnosis not present

## 2018-07-18 DIAGNOSIS — F329 Major depressive disorder, single episode, unspecified: Secondary | ICD-10-CM | POA: Diagnosis not present

## 2018-07-19 ENCOUNTER — Other Ambulatory Visit (HOSPITAL_COMMUNITY): Payer: Federal, State, Local not specified - PPO

## 2018-07-20 DIAGNOSIS — K5909 Other constipation: Secondary | ICD-10-CM | POA: Diagnosis not present

## 2018-07-20 DIAGNOSIS — F329 Major depressive disorder, single episode, unspecified: Secondary | ICD-10-CM | POA: Diagnosis not present

## 2018-07-20 DIAGNOSIS — Z7982 Long term (current) use of aspirin: Secondary | ICD-10-CM | POA: Diagnosis not present

## 2018-07-20 DIAGNOSIS — Z471 Aftercare following joint replacement surgery: Secondary | ICD-10-CM | POA: Diagnosis not present

## 2018-07-20 DIAGNOSIS — E669 Obesity, unspecified: Secondary | ICD-10-CM | POA: Diagnosis not present

## 2018-07-20 DIAGNOSIS — I10 Essential (primary) hypertension: Secondary | ICD-10-CM | POA: Diagnosis not present

## 2018-07-20 DIAGNOSIS — Z6833 Body mass index (BMI) 33.0-33.9, adult: Secondary | ICD-10-CM | POA: Diagnosis not present

## 2018-07-20 DIAGNOSIS — Z96651 Presence of right artificial knee joint: Secondary | ICD-10-CM | POA: Diagnosis not present

## 2018-07-20 DIAGNOSIS — K219 Gastro-esophageal reflux disease without esophagitis: Secondary | ICD-10-CM | POA: Diagnosis not present

## 2018-07-20 DIAGNOSIS — F419 Anxiety disorder, unspecified: Secondary | ICD-10-CM | POA: Diagnosis not present

## 2018-07-20 DIAGNOSIS — Z9181 History of falling: Secondary | ICD-10-CM | POA: Diagnosis not present

## 2018-07-22 DIAGNOSIS — E669 Obesity, unspecified: Secondary | ICD-10-CM | POA: Diagnosis not present

## 2018-07-22 DIAGNOSIS — F329 Major depressive disorder, single episode, unspecified: Secondary | ICD-10-CM | POA: Diagnosis not present

## 2018-07-22 DIAGNOSIS — Z471 Aftercare following joint replacement surgery: Secondary | ICD-10-CM | POA: Diagnosis not present

## 2018-07-22 DIAGNOSIS — Z7982 Long term (current) use of aspirin: Secondary | ICD-10-CM | POA: Diagnosis not present

## 2018-07-22 DIAGNOSIS — K5909 Other constipation: Secondary | ICD-10-CM | POA: Diagnosis not present

## 2018-07-22 DIAGNOSIS — K219 Gastro-esophageal reflux disease without esophagitis: Secondary | ICD-10-CM | POA: Diagnosis not present

## 2018-07-22 DIAGNOSIS — Z9181 History of falling: Secondary | ICD-10-CM | POA: Diagnosis not present

## 2018-07-22 DIAGNOSIS — Z6833 Body mass index (BMI) 33.0-33.9, adult: Secondary | ICD-10-CM | POA: Diagnosis not present

## 2018-07-22 DIAGNOSIS — I10 Essential (primary) hypertension: Secondary | ICD-10-CM | POA: Diagnosis not present

## 2018-07-22 DIAGNOSIS — Z96651 Presence of right artificial knee joint: Secondary | ICD-10-CM | POA: Diagnosis not present

## 2018-07-22 DIAGNOSIS — F419 Anxiety disorder, unspecified: Secondary | ICD-10-CM | POA: Diagnosis not present

## 2018-07-27 DIAGNOSIS — M1711 Unilateral primary osteoarthritis, right knee: Secondary | ICD-10-CM | POA: Diagnosis not present

## 2018-07-28 DIAGNOSIS — Z9181 History of falling: Secondary | ICD-10-CM | POA: Diagnosis not present

## 2018-07-28 DIAGNOSIS — Z7982 Long term (current) use of aspirin: Secondary | ICD-10-CM | POA: Diagnosis not present

## 2018-07-28 DIAGNOSIS — Z96651 Presence of right artificial knee joint: Secondary | ICD-10-CM | POA: Diagnosis not present

## 2018-07-28 DIAGNOSIS — K5909 Other constipation: Secondary | ICD-10-CM | POA: Diagnosis not present

## 2018-07-28 DIAGNOSIS — I10 Essential (primary) hypertension: Secondary | ICD-10-CM | POA: Diagnosis not present

## 2018-07-28 DIAGNOSIS — Z471 Aftercare following joint replacement surgery: Secondary | ICD-10-CM | POA: Diagnosis not present

## 2018-07-28 DIAGNOSIS — E669 Obesity, unspecified: Secondary | ICD-10-CM | POA: Diagnosis not present

## 2018-07-28 DIAGNOSIS — Z6833 Body mass index (BMI) 33.0-33.9, adult: Secondary | ICD-10-CM | POA: Diagnosis not present

## 2018-07-28 DIAGNOSIS — F329 Major depressive disorder, single episode, unspecified: Secondary | ICD-10-CM | POA: Diagnosis not present

## 2018-07-28 DIAGNOSIS — K219 Gastro-esophageal reflux disease without esophagitis: Secondary | ICD-10-CM | POA: Diagnosis not present

## 2018-07-28 DIAGNOSIS — F419 Anxiety disorder, unspecified: Secondary | ICD-10-CM | POA: Diagnosis not present

## 2018-08-01 DIAGNOSIS — F411 Generalized anxiety disorder: Secondary | ICD-10-CM | POA: Diagnosis not present

## 2018-08-02 DIAGNOSIS — Z7982 Long term (current) use of aspirin: Secondary | ICD-10-CM | POA: Diagnosis not present

## 2018-08-02 DIAGNOSIS — I10 Essential (primary) hypertension: Secondary | ICD-10-CM | POA: Diagnosis not present

## 2018-08-02 DIAGNOSIS — Z96651 Presence of right artificial knee joint: Secondary | ICD-10-CM | POA: Diagnosis not present

## 2018-08-02 DIAGNOSIS — E669 Obesity, unspecified: Secondary | ICD-10-CM | POA: Diagnosis not present

## 2018-08-02 DIAGNOSIS — K5909 Other constipation: Secondary | ICD-10-CM | POA: Diagnosis not present

## 2018-08-02 DIAGNOSIS — F419 Anxiety disorder, unspecified: Secondary | ICD-10-CM | POA: Diagnosis not present

## 2018-08-02 DIAGNOSIS — Z9181 History of falling: Secondary | ICD-10-CM | POA: Diagnosis not present

## 2018-08-02 DIAGNOSIS — K219 Gastro-esophageal reflux disease without esophagitis: Secondary | ICD-10-CM | POA: Diagnosis not present

## 2018-08-02 DIAGNOSIS — F329 Major depressive disorder, single episode, unspecified: Secondary | ICD-10-CM | POA: Diagnosis not present

## 2018-08-02 DIAGNOSIS — Z6833 Body mass index (BMI) 33.0-33.9, adult: Secondary | ICD-10-CM | POA: Diagnosis not present

## 2018-08-02 DIAGNOSIS — Z471 Aftercare following joint replacement surgery: Secondary | ICD-10-CM | POA: Diagnosis not present

## 2018-08-04 DIAGNOSIS — Z7982 Long term (current) use of aspirin: Secondary | ICD-10-CM | POA: Diagnosis not present

## 2018-08-04 DIAGNOSIS — Z471 Aftercare following joint replacement surgery: Secondary | ICD-10-CM | POA: Diagnosis not present

## 2018-08-04 DIAGNOSIS — K219 Gastro-esophageal reflux disease without esophagitis: Secondary | ICD-10-CM | POA: Diagnosis not present

## 2018-08-04 DIAGNOSIS — F329 Major depressive disorder, single episode, unspecified: Secondary | ICD-10-CM | POA: Diagnosis not present

## 2018-08-04 DIAGNOSIS — F419 Anxiety disorder, unspecified: Secondary | ICD-10-CM | POA: Diagnosis not present

## 2018-08-04 DIAGNOSIS — Z96651 Presence of right artificial knee joint: Secondary | ICD-10-CM | POA: Diagnosis not present

## 2018-08-04 DIAGNOSIS — I10 Essential (primary) hypertension: Secondary | ICD-10-CM | POA: Diagnosis not present

## 2018-08-04 DIAGNOSIS — Z6833 Body mass index (BMI) 33.0-33.9, adult: Secondary | ICD-10-CM | POA: Diagnosis not present

## 2018-08-04 DIAGNOSIS — Z9181 History of falling: Secondary | ICD-10-CM | POA: Diagnosis not present

## 2018-08-04 DIAGNOSIS — E669 Obesity, unspecified: Secondary | ICD-10-CM | POA: Diagnosis not present

## 2018-08-04 DIAGNOSIS — K5909 Other constipation: Secondary | ICD-10-CM | POA: Diagnosis not present

## 2018-08-09 DIAGNOSIS — I10 Essential (primary) hypertension: Secondary | ICD-10-CM | POA: Diagnosis not present

## 2018-08-09 DIAGNOSIS — K5909 Other constipation: Secondary | ICD-10-CM | POA: Diagnosis not present

## 2018-08-09 DIAGNOSIS — E669 Obesity, unspecified: Secondary | ICD-10-CM | POA: Diagnosis not present

## 2018-08-09 DIAGNOSIS — K219 Gastro-esophageal reflux disease without esophagitis: Secondary | ICD-10-CM | POA: Diagnosis not present

## 2018-08-09 DIAGNOSIS — Z471 Aftercare following joint replacement surgery: Secondary | ICD-10-CM | POA: Diagnosis not present

## 2018-08-09 DIAGNOSIS — Z96651 Presence of right artificial knee joint: Secondary | ICD-10-CM | POA: Diagnosis not present

## 2018-08-09 DIAGNOSIS — Z9181 History of falling: Secondary | ICD-10-CM | POA: Diagnosis not present

## 2018-08-09 DIAGNOSIS — F329 Major depressive disorder, single episode, unspecified: Secondary | ICD-10-CM | POA: Diagnosis not present

## 2018-08-09 DIAGNOSIS — Z6833 Body mass index (BMI) 33.0-33.9, adult: Secondary | ICD-10-CM | POA: Diagnosis not present

## 2018-08-09 DIAGNOSIS — Z7982 Long term (current) use of aspirin: Secondary | ICD-10-CM | POA: Diagnosis not present

## 2018-08-09 DIAGNOSIS — F419 Anxiety disorder, unspecified: Secondary | ICD-10-CM | POA: Diagnosis not present

## 2018-08-10 DIAGNOSIS — F411 Generalized anxiety disorder: Secondary | ICD-10-CM | POA: Diagnosis not present

## 2018-08-11 DIAGNOSIS — E669 Obesity, unspecified: Secondary | ICD-10-CM | POA: Diagnosis not present

## 2018-08-11 DIAGNOSIS — I10 Essential (primary) hypertension: Secondary | ICD-10-CM | POA: Diagnosis not present

## 2018-08-11 DIAGNOSIS — F329 Major depressive disorder, single episode, unspecified: Secondary | ICD-10-CM | POA: Diagnosis not present

## 2018-08-11 DIAGNOSIS — Z7982 Long term (current) use of aspirin: Secondary | ICD-10-CM | POA: Diagnosis not present

## 2018-08-11 DIAGNOSIS — Z471 Aftercare following joint replacement surgery: Secondary | ICD-10-CM | POA: Diagnosis not present

## 2018-08-11 DIAGNOSIS — Z96651 Presence of right artificial knee joint: Secondary | ICD-10-CM | POA: Diagnosis not present

## 2018-08-11 DIAGNOSIS — Z9181 History of falling: Secondary | ICD-10-CM | POA: Diagnosis not present

## 2018-08-11 DIAGNOSIS — Z6833 Body mass index (BMI) 33.0-33.9, adult: Secondary | ICD-10-CM | POA: Diagnosis not present

## 2018-08-11 DIAGNOSIS — K5909 Other constipation: Secondary | ICD-10-CM | POA: Diagnosis not present

## 2018-08-11 DIAGNOSIS — K219 Gastro-esophageal reflux disease without esophagitis: Secondary | ICD-10-CM | POA: Diagnosis not present

## 2018-08-11 DIAGNOSIS — F419 Anxiety disorder, unspecified: Secondary | ICD-10-CM | POA: Diagnosis not present

## 2018-08-16 DIAGNOSIS — Z471 Aftercare following joint replacement surgery: Secondary | ICD-10-CM | POA: Diagnosis not present

## 2018-08-16 DIAGNOSIS — F419 Anxiety disorder, unspecified: Secondary | ICD-10-CM | POA: Diagnosis not present

## 2018-08-16 DIAGNOSIS — K219 Gastro-esophageal reflux disease without esophagitis: Secondary | ICD-10-CM | POA: Diagnosis not present

## 2018-08-16 DIAGNOSIS — F329 Major depressive disorder, single episode, unspecified: Secondary | ICD-10-CM | POA: Diagnosis not present

## 2018-08-16 DIAGNOSIS — K5909 Other constipation: Secondary | ICD-10-CM | POA: Diagnosis not present

## 2018-08-16 DIAGNOSIS — Z6833 Body mass index (BMI) 33.0-33.9, adult: Secondary | ICD-10-CM | POA: Diagnosis not present

## 2018-08-16 DIAGNOSIS — I10 Essential (primary) hypertension: Secondary | ICD-10-CM | POA: Diagnosis not present

## 2018-08-16 DIAGNOSIS — E669 Obesity, unspecified: Secondary | ICD-10-CM | POA: Diagnosis not present

## 2018-08-16 DIAGNOSIS — Z9181 History of falling: Secondary | ICD-10-CM | POA: Diagnosis not present

## 2018-08-16 DIAGNOSIS — Z7982 Long term (current) use of aspirin: Secondary | ICD-10-CM | POA: Diagnosis not present

## 2018-08-16 DIAGNOSIS — Z96651 Presence of right artificial knee joint: Secondary | ICD-10-CM | POA: Diagnosis not present

## 2018-08-17 DIAGNOSIS — F419 Anxiety disorder, unspecified: Secondary | ICD-10-CM | POA: Diagnosis not present

## 2018-08-17 DIAGNOSIS — Z471 Aftercare following joint replacement surgery: Secondary | ICD-10-CM | POA: Diagnosis not present

## 2018-08-17 DIAGNOSIS — Z9181 History of falling: Secondary | ICD-10-CM | POA: Diagnosis not present

## 2018-08-17 DIAGNOSIS — K5909 Other constipation: Secondary | ICD-10-CM | POA: Diagnosis not present

## 2018-08-17 DIAGNOSIS — F329 Major depressive disorder, single episode, unspecified: Secondary | ICD-10-CM | POA: Diagnosis not present

## 2018-08-17 DIAGNOSIS — I10 Essential (primary) hypertension: Secondary | ICD-10-CM | POA: Diagnosis not present

## 2018-08-17 DIAGNOSIS — K219 Gastro-esophageal reflux disease without esophagitis: Secondary | ICD-10-CM | POA: Diagnosis not present

## 2018-08-17 DIAGNOSIS — Z6833 Body mass index (BMI) 33.0-33.9, adult: Secondary | ICD-10-CM | POA: Diagnosis not present

## 2018-08-17 DIAGNOSIS — Z96651 Presence of right artificial knee joint: Secondary | ICD-10-CM | POA: Diagnosis not present

## 2018-08-17 DIAGNOSIS — Z7982 Long term (current) use of aspirin: Secondary | ICD-10-CM | POA: Diagnosis not present

## 2018-08-17 DIAGNOSIS — E669 Obesity, unspecified: Secondary | ICD-10-CM | POA: Diagnosis not present

## 2018-08-23 DIAGNOSIS — Z7982 Long term (current) use of aspirin: Secondary | ICD-10-CM | POA: Diagnosis not present

## 2018-08-23 DIAGNOSIS — I10 Essential (primary) hypertension: Secondary | ICD-10-CM | POA: Diagnosis not present

## 2018-08-23 DIAGNOSIS — Z6833 Body mass index (BMI) 33.0-33.9, adult: Secondary | ICD-10-CM | POA: Diagnosis not present

## 2018-08-23 DIAGNOSIS — E669 Obesity, unspecified: Secondary | ICD-10-CM | POA: Diagnosis not present

## 2018-08-23 DIAGNOSIS — K219 Gastro-esophageal reflux disease without esophagitis: Secondary | ICD-10-CM | POA: Diagnosis not present

## 2018-08-23 DIAGNOSIS — Z471 Aftercare following joint replacement surgery: Secondary | ICD-10-CM | POA: Diagnosis not present

## 2018-08-23 DIAGNOSIS — Z96651 Presence of right artificial knee joint: Secondary | ICD-10-CM | POA: Diagnosis not present

## 2018-08-23 DIAGNOSIS — F419 Anxiety disorder, unspecified: Secondary | ICD-10-CM | POA: Diagnosis not present

## 2018-08-23 DIAGNOSIS — K5909 Other constipation: Secondary | ICD-10-CM | POA: Diagnosis not present

## 2018-08-23 DIAGNOSIS — F329 Major depressive disorder, single episode, unspecified: Secondary | ICD-10-CM | POA: Diagnosis not present

## 2018-08-23 DIAGNOSIS — Z9181 History of falling: Secondary | ICD-10-CM | POA: Diagnosis not present

## 2018-08-24 DIAGNOSIS — F411 Generalized anxiety disorder: Secondary | ICD-10-CM | POA: Diagnosis not present

## 2018-08-25 DIAGNOSIS — Z6833 Body mass index (BMI) 33.0-33.9, adult: Secondary | ICD-10-CM | POA: Diagnosis not present

## 2018-08-25 DIAGNOSIS — E669 Obesity, unspecified: Secondary | ICD-10-CM | POA: Diagnosis not present

## 2018-08-25 DIAGNOSIS — I10 Essential (primary) hypertension: Secondary | ICD-10-CM | POA: Diagnosis not present

## 2018-08-25 DIAGNOSIS — Z471 Aftercare following joint replacement surgery: Secondary | ICD-10-CM | POA: Diagnosis not present

## 2018-08-25 DIAGNOSIS — Z9181 History of falling: Secondary | ICD-10-CM | POA: Diagnosis not present

## 2018-08-25 DIAGNOSIS — F419 Anxiety disorder, unspecified: Secondary | ICD-10-CM | POA: Diagnosis not present

## 2018-08-25 DIAGNOSIS — K5909 Other constipation: Secondary | ICD-10-CM | POA: Diagnosis not present

## 2018-08-25 DIAGNOSIS — Z96651 Presence of right artificial knee joint: Secondary | ICD-10-CM | POA: Diagnosis not present

## 2018-08-25 DIAGNOSIS — K219 Gastro-esophageal reflux disease without esophagitis: Secondary | ICD-10-CM | POA: Diagnosis not present

## 2018-08-25 DIAGNOSIS — Z7982 Long term (current) use of aspirin: Secondary | ICD-10-CM | POA: Diagnosis not present

## 2018-08-25 DIAGNOSIS — F329 Major depressive disorder, single episode, unspecified: Secondary | ICD-10-CM | POA: Diagnosis not present

## 2018-08-30 DIAGNOSIS — Z9181 History of falling: Secondary | ICD-10-CM | POA: Diagnosis not present

## 2018-08-30 DIAGNOSIS — I10 Essential (primary) hypertension: Secondary | ICD-10-CM | POA: Diagnosis not present

## 2018-08-30 DIAGNOSIS — K219 Gastro-esophageal reflux disease without esophagitis: Secondary | ICD-10-CM | POA: Diagnosis not present

## 2018-08-30 DIAGNOSIS — F329 Major depressive disorder, single episode, unspecified: Secondary | ICD-10-CM | POA: Diagnosis not present

## 2018-08-30 DIAGNOSIS — F419 Anxiety disorder, unspecified: Secondary | ICD-10-CM | POA: Diagnosis not present

## 2018-08-30 DIAGNOSIS — K5909 Other constipation: Secondary | ICD-10-CM | POA: Diagnosis not present

## 2018-08-30 DIAGNOSIS — Z96651 Presence of right artificial knee joint: Secondary | ICD-10-CM | POA: Diagnosis not present

## 2018-08-30 DIAGNOSIS — Z6833 Body mass index (BMI) 33.0-33.9, adult: Secondary | ICD-10-CM | POA: Diagnosis not present

## 2018-08-30 DIAGNOSIS — Z7982 Long term (current) use of aspirin: Secondary | ICD-10-CM | POA: Diagnosis not present

## 2018-08-30 DIAGNOSIS — E669 Obesity, unspecified: Secondary | ICD-10-CM | POA: Diagnosis not present

## 2018-08-30 DIAGNOSIS — Z471 Aftercare following joint replacement surgery: Secondary | ICD-10-CM | POA: Diagnosis not present

## 2018-09-01 DIAGNOSIS — Z9181 History of falling: Secondary | ICD-10-CM | POA: Diagnosis not present

## 2018-09-01 DIAGNOSIS — K5909 Other constipation: Secondary | ICD-10-CM | POA: Diagnosis not present

## 2018-09-01 DIAGNOSIS — Z7982 Long term (current) use of aspirin: Secondary | ICD-10-CM | POA: Diagnosis not present

## 2018-09-01 DIAGNOSIS — Z96651 Presence of right artificial knee joint: Secondary | ICD-10-CM | POA: Diagnosis not present

## 2018-09-01 DIAGNOSIS — F329 Major depressive disorder, single episode, unspecified: Secondary | ICD-10-CM | POA: Diagnosis not present

## 2018-09-01 DIAGNOSIS — Z6833 Body mass index (BMI) 33.0-33.9, adult: Secondary | ICD-10-CM | POA: Diagnosis not present

## 2018-09-01 DIAGNOSIS — I10 Essential (primary) hypertension: Secondary | ICD-10-CM | POA: Diagnosis not present

## 2018-09-01 DIAGNOSIS — F419 Anxiety disorder, unspecified: Secondary | ICD-10-CM | POA: Diagnosis not present

## 2018-09-01 DIAGNOSIS — E669 Obesity, unspecified: Secondary | ICD-10-CM | POA: Diagnosis not present

## 2018-09-01 DIAGNOSIS — Z471 Aftercare following joint replacement surgery: Secondary | ICD-10-CM | POA: Diagnosis not present

## 2018-09-01 DIAGNOSIS — K219 Gastro-esophageal reflux disease without esophagitis: Secondary | ICD-10-CM | POA: Diagnosis not present

## 2018-09-02 DIAGNOSIS — F411 Generalized anxiety disorder: Secondary | ICD-10-CM | POA: Diagnosis not present

## 2018-09-05 DIAGNOSIS — M545 Low back pain: Secondary | ICD-10-CM | POA: Diagnosis not present

## 2018-09-06 DIAGNOSIS — E669 Obesity, unspecified: Secondary | ICD-10-CM | POA: Diagnosis not present

## 2018-09-06 DIAGNOSIS — F329 Major depressive disorder, single episode, unspecified: Secondary | ICD-10-CM | POA: Diagnosis not present

## 2018-09-06 DIAGNOSIS — Z7982 Long term (current) use of aspirin: Secondary | ICD-10-CM | POA: Diagnosis not present

## 2018-09-06 DIAGNOSIS — F419 Anxiety disorder, unspecified: Secondary | ICD-10-CM | POA: Diagnosis not present

## 2018-09-06 DIAGNOSIS — Z9181 History of falling: Secondary | ICD-10-CM | POA: Diagnosis not present

## 2018-09-06 DIAGNOSIS — Z471 Aftercare following joint replacement surgery: Secondary | ICD-10-CM | POA: Diagnosis not present

## 2018-09-06 DIAGNOSIS — Z96651 Presence of right artificial knee joint: Secondary | ICD-10-CM | POA: Diagnosis not present

## 2018-09-06 DIAGNOSIS — I10 Essential (primary) hypertension: Secondary | ICD-10-CM | POA: Diagnosis not present

## 2018-09-06 DIAGNOSIS — K5909 Other constipation: Secondary | ICD-10-CM | POA: Diagnosis not present

## 2018-09-06 DIAGNOSIS — Z6833 Body mass index (BMI) 33.0-33.9, adult: Secondary | ICD-10-CM | POA: Diagnosis not present

## 2018-09-06 DIAGNOSIS — K219 Gastro-esophageal reflux disease without esophagitis: Secondary | ICD-10-CM | POA: Diagnosis not present

## 2018-09-07 DIAGNOSIS — F411 Generalized anxiety disorder: Secondary | ICD-10-CM | POA: Diagnosis not present

## 2018-09-09 DIAGNOSIS — Z9181 History of falling: Secondary | ICD-10-CM | POA: Diagnosis not present

## 2018-09-09 DIAGNOSIS — Z471 Aftercare following joint replacement surgery: Secondary | ICD-10-CM | POA: Diagnosis not present

## 2018-09-09 DIAGNOSIS — M545 Low back pain: Secondary | ICD-10-CM | POA: Diagnosis not present

## 2018-09-09 DIAGNOSIS — E669 Obesity, unspecified: Secondary | ICD-10-CM | POA: Diagnosis not present

## 2018-09-09 DIAGNOSIS — F419 Anxiety disorder, unspecified: Secondary | ICD-10-CM | POA: Diagnosis not present

## 2018-09-09 DIAGNOSIS — K219 Gastro-esophageal reflux disease without esophagitis: Secondary | ICD-10-CM | POA: Diagnosis not present

## 2018-09-09 DIAGNOSIS — F329 Major depressive disorder, single episode, unspecified: Secondary | ICD-10-CM | POA: Diagnosis not present

## 2018-09-09 DIAGNOSIS — K5909 Other constipation: Secondary | ICD-10-CM | POA: Diagnosis not present

## 2018-09-09 DIAGNOSIS — Z7982 Long term (current) use of aspirin: Secondary | ICD-10-CM | POA: Diagnosis not present

## 2018-09-09 DIAGNOSIS — Z96651 Presence of right artificial knee joint: Secondary | ICD-10-CM | POA: Diagnosis not present

## 2018-09-09 DIAGNOSIS — I10 Essential (primary) hypertension: Secondary | ICD-10-CM | POA: Diagnosis not present

## 2018-09-09 DIAGNOSIS — Z6833 Body mass index (BMI) 33.0-33.9, adult: Secondary | ICD-10-CM | POA: Diagnosis not present

## 2018-09-13 DIAGNOSIS — M5416 Radiculopathy, lumbar region: Secondary | ICD-10-CM | POA: Diagnosis not present

## 2018-09-13 DIAGNOSIS — M545 Low back pain: Secondary | ICD-10-CM | POA: Diagnosis not present

## 2018-09-14 DIAGNOSIS — Z6833 Body mass index (BMI) 33.0-33.9, adult: Secondary | ICD-10-CM | POA: Diagnosis not present

## 2018-09-14 DIAGNOSIS — Z471 Aftercare following joint replacement surgery: Secondary | ICD-10-CM | POA: Diagnosis not present

## 2018-09-14 DIAGNOSIS — F329 Major depressive disorder, single episode, unspecified: Secondary | ICD-10-CM | POA: Diagnosis not present

## 2018-09-14 DIAGNOSIS — M5116 Intervertebral disc disorders with radiculopathy, lumbar region: Secondary | ICD-10-CM | POA: Diagnosis not present

## 2018-09-14 DIAGNOSIS — F419 Anxiety disorder, unspecified: Secondary | ICD-10-CM | POA: Diagnosis not present

## 2018-09-14 DIAGNOSIS — E669 Obesity, unspecified: Secondary | ICD-10-CM | POA: Diagnosis not present

## 2018-09-14 DIAGNOSIS — K5909 Other constipation: Secondary | ICD-10-CM | POA: Diagnosis not present

## 2018-09-14 DIAGNOSIS — I10 Essential (primary) hypertension: Secondary | ICD-10-CM | POA: Diagnosis not present

## 2018-09-14 DIAGNOSIS — Z9181 History of falling: Secondary | ICD-10-CM | POA: Diagnosis not present

## 2018-09-14 DIAGNOSIS — Z96651 Presence of right artificial knee joint: Secondary | ICD-10-CM | POA: Diagnosis not present

## 2018-09-14 DIAGNOSIS — K219 Gastro-esophageal reflux disease without esophagitis: Secondary | ICD-10-CM | POA: Diagnosis not present

## 2018-09-14 DIAGNOSIS — Z7982 Long term (current) use of aspirin: Secondary | ICD-10-CM | POA: Diagnosis not present

## 2018-09-15 DIAGNOSIS — Z96651 Presence of right artificial knee joint: Secondary | ICD-10-CM | POA: Diagnosis not present

## 2018-09-15 DIAGNOSIS — F419 Anxiety disorder, unspecified: Secondary | ICD-10-CM | POA: Diagnosis not present

## 2018-09-15 DIAGNOSIS — M5116 Intervertebral disc disorders with radiculopathy, lumbar region: Secondary | ICD-10-CM | POA: Diagnosis not present

## 2018-09-15 DIAGNOSIS — Z7982 Long term (current) use of aspirin: Secondary | ICD-10-CM | POA: Diagnosis not present

## 2018-09-15 DIAGNOSIS — F329 Major depressive disorder, single episode, unspecified: Secondary | ICD-10-CM | POA: Diagnosis not present

## 2018-09-15 DIAGNOSIS — Z471 Aftercare following joint replacement surgery: Secondary | ICD-10-CM | POA: Diagnosis not present

## 2018-09-15 DIAGNOSIS — Z9181 History of falling: Secondary | ICD-10-CM | POA: Diagnosis not present

## 2018-09-15 DIAGNOSIS — I10 Essential (primary) hypertension: Secondary | ICD-10-CM | POA: Diagnosis not present

## 2018-09-15 DIAGNOSIS — Z6833 Body mass index (BMI) 33.0-33.9, adult: Secondary | ICD-10-CM | POA: Diagnosis not present

## 2018-09-15 DIAGNOSIS — K5909 Other constipation: Secondary | ICD-10-CM | POA: Diagnosis not present

## 2018-09-15 DIAGNOSIS — E669 Obesity, unspecified: Secondary | ICD-10-CM | POA: Diagnosis not present

## 2018-09-15 DIAGNOSIS — K219 Gastro-esophageal reflux disease without esophagitis: Secondary | ICD-10-CM | POA: Diagnosis not present

## 2018-09-16 DIAGNOSIS — F411 Generalized anxiety disorder: Secondary | ICD-10-CM | POA: Diagnosis not present

## 2018-09-20 DIAGNOSIS — Z471 Aftercare following joint replacement surgery: Secondary | ICD-10-CM | POA: Diagnosis not present

## 2018-09-20 DIAGNOSIS — F329 Major depressive disorder, single episode, unspecified: Secondary | ICD-10-CM | POA: Diagnosis not present

## 2018-09-20 DIAGNOSIS — Z96651 Presence of right artificial knee joint: Secondary | ICD-10-CM | POA: Diagnosis not present

## 2018-09-20 DIAGNOSIS — M5116 Intervertebral disc disorders with radiculopathy, lumbar region: Secondary | ICD-10-CM | POA: Diagnosis not present

## 2018-09-20 DIAGNOSIS — I10 Essential (primary) hypertension: Secondary | ICD-10-CM | POA: Diagnosis not present

## 2018-09-20 DIAGNOSIS — Z9181 History of falling: Secondary | ICD-10-CM | POA: Diagnosis not present

## 2018-09-20 DIAGNOSIS — F419 Anxiety disorder, unspecified: Secondary | ICD-10-CM | POA: Diagnosis not present

## 2018-09-20 DIAGNOSIS — Z7982 Long term (current) use of aspirin: Secondary | ICD-10-CM | POA: Diagnosis not present

## 2018-09-20 DIAGNOSIS — E669 Obesity, unspecified: Secondary | ICD-10-CM | POA: Diagnosis not present

## 2018-09-20 DIAGNOSIS — K5909 Other constipation: Secondary | ICD-10-CM | POA: Diagnosis not present

## 2018-09-20 DIAGNOSIS — K219 Gastro-esophageal reflux disease without esophagitis: Secondary | ICD-10-CM | POA: Diagnosis not present

## 2018-09-20 DIAGNOSIS — Z6833 Body mass index (BMI) 33.0-33.9, adult: Secondary | ICD-10-CM | POA: Diagnosis not present

## 2018-09-22 DIAGNOSIS — M5116 Intervertebral disc disorders with radiculopathy, lumbar region: Secondary | ICD-10-CM | POA: Diagnosis not present

## 2018-09-22 DIAGNOSIS — F329 Major depressive disorder, single episode, unspecified: Secondary | ICD-10-CM | POA: Diagnosis not present

## 2018-09-22 DIAGNOSIS — Z6833 Body mass index (BMI) 33.0-33.9, adult: Secondary | ICD-10-CM | POA: Diagnosis not present

## 2018-09-22 DIAGNOSIS — I10 Essential (primary) hypertension: Secondary | ICD-10-CM | POA: Diagnosis not present

## 2018-09-22 DIAGNOSIS — Z9181 History of falling: Secondary | ICD-10-CM | POA: Diagnosis not present

## 2018-09-22 DIAGNOSIS — E669 Obesity, unspecified: Secondary | ICD-10-CM | POA: Diagnosis not present

## 2018-09-22 DIAGNOSIS — K219 Gastro-esophageal reflux disease without esophagitis: Secondary | ICD-10-CM | POA: Diagnosis not present

## 2018-09-22 DIAGNOSIS — F419 Anxiety disorder, unspecified: Secondary | ICD-10-CM | POA: Diagnosis not present

## 2018-09-22 DIAGNOSIS — K5909 Other constipation: Secondary | ICD-10-CM | POA: Diagnosis not present

## 2018-09-22 DIAGNOSIS — Z471 Aftercare following joint replacement surgery: Secondary | ICD-10-CM | POA: Diagnosis not present

## 2018-09-22 DIAGNOSIS — Z7982 Long term (current) use of aspirin: Secondary | ICD-10-CM | POA: Diagnosis not present

## 2018-09-22 DIAGNOSIS — Z96651 Presence of right artificial knee joint: Secondary | ICD-10-CM | POA: Diagnosis not present

## 2018-09-27 DIAGNOSIS — Z9181 History of falling: Secondary | ICD-10-CM | POA: Diagnosis not present

## 2018-09-27 DIAGNOSIS — E669 Obesity, unspecified: Secondary | ICD-10-CM | POA: Diagnosis not present

## 2018-09-27 DIAGNOSIS — F329 Major depressive disorder, single episode, unspecified: Secondary | ICD-10-CM | POA: Diagnosis not present

## 2018-09-27 DIAGNOSIS — M5116 Intervertebral disc disorders with radiculopathy, lumbar region: Secondary | ICD-10-CM | POA: Diagnosis not present

## 2018-09-27 DIAGNOSIS — F419 Anxiety disorder, unspecified: Secondary | ICD-10-CM | POA: Diagnosis not present

## 2018-09-27 DIAGNOSIS — I10 Essential (primary) hypertension: Secondary | ICD-10-CM | POA: Diagnosis not present

## 2018-09-27 DIAGNOSIS — K5909 Other constipation: Secondary | ICD-10-CM | POA: Diagnosis not present

## 2018-09-27 DIAGNOSIS — Z7982 Long term (current) use of aspirin: Secondary | ICD-10-CM | POA: Diagnosis not present

## 2018-09-27 DIAGNOSIS — Z471 Aftercare following joint replacement surgery: Secondary | ICD-10-CM | POA: Diagnosis not present

## 2018-09-27 DIAGNOSIS — Z6833 Body mass index (BMI) 33.0-33.9, adult: Secondary | ICD-10-CM | POA: Diagnosis not present

## 2018-09-27 DIAGNOSIS — Z96651 Presence of right artificial knee joint: Secondary | ICD-10-CM | POA: Diagnosis not present

## 2018-09-27 DIAGNOSIS — K219 Gastro-esophageal reflux disease without esophagitis: Secondary | ICD-10-CM | POA: Diagnosis not present

## 2018-09-29 DIAGNOSIS — F329 Major depressive disorder, single episode, unspecified: Secondary | ICD-10-CM | POA: Diagnosis not present

## 2018-09-29 DIAGNOSIS — F419 Anxiety disorder, unspecified: Secondary | ICD-10-CM | POA: Diagnosis not present

## 2018-09-29 DIAGNOSIS — K5909 Other constipation: Secondary | ICD-10-CM | POA: Diagnosis not present

## 2018-09-29 DIAGNOSIS — K219 Gastro-esophageal reflux disease without esophagitis: Secondary | ICD-10-CM | POA: Diagnosis not present

## 2018-09-29 DIAGNOSIS — Z9181 History of falling: Secondary | ICD-10-CM | POA: Diagnosis not present

## 2018-09-29 DIAGNOSIS — Z96651 Presence of right artificial knee joint: Secondary | ICD-10-CM | POA: Diagnosis not present

## 2018-09-29 DIAGNOSIS — Z471 Aftercare following joint replacement surgery: Secondary | ICD-10-CM | POA: Diagnosis not present

## 2018-09-29 DIAGNOSIS — Z7982 Long term (current) use of aspirin: Secondary | ICD-10-CM | POA: Diagnosis not present

## 2018-09-29 DIAGNOSIS — Z6833 Body mass index (BMI) 33.0-33.9, adult: Secondary | ICD-10-CM | POA: Diagnosis not present

## 2018-09-29 DIAGNOSIS — E669 Obesity, unspecified: Secondary | ICD-10-CM | POA: Diagnosis not present

## 2018-09-29 DIAGNOSIS — I10 Essential (primary) hypertension: Secondary | ICD-10-CM | POA: Diagnosis not present

## 2018-09-29 DIAGNOSIS — M5116 Intervertebral disc disorders with radiculopathy, lumbar region: Secondary | ICD-10-CM | POA: Diagnosis not present

## 2018-09-30 DIAGNOSIS — F411 Generalized anxiety disorder: Secondary | ICD-10-CM | POA: Diagnosis not present

## 2018-10-04 DIAGNOSIS — K219 Gastro-esophageal reflux disease without esophagitis: Secondary | ICD-10-CM | POA: Diagnosis not present

## 2018-10-04 DIAGNOSIS — E669 Obesity, unspecified: Secondary | ICD-10-CM | POA: Diagnosis not present

## 2018-10-04 DIAGNOSIS — F329 Major depressive disorder, single episode, unspecified: Secondary | ICD-10-CM | POA: Diagnosis not present

## 2018-10-04 DIAGNOSIS — Z9181 History of falling: Secondary | ICD-10-CM | POA: Diagnosis not present

## 2018-10-04 DIAGNOSIS — F419 Anxiety disorder, unspecified: Secondary | ICD-10-CM | POA: Diagnosis not present

## 2018-10-04 DIAGNOSIS — M5116 Intervertebral disc disorders with radiculopathy, lumbar region: Secondary | ICD-10-CM | POA: Diagnosis not present

## 2018-10-04 DIAGNOSIS — Z96651 Presence of right artificial knee joint: Secondary | ICD-10-CM | POA: Diagnosis not present

## 2018-10-04 DIAGNOSIS — Z7982 Long term (current) use of aspirin: Secondary | ICD-10-CM | POA: Diagnosis not present

## 2018-10-04 DIAGNOSIS — Z471 Aftercare following joint replacement surgery: Secondary | ICD-10-CM | POA: Diagnosis not present

## 2018-10-04 DIAGNOSIS — Z6833 Body mass index (BMI) 33.0-33.9, adult: Secondary | ICD-10-CM | POA: Diagnosis not present

## 2018-10-04 DIAGNOSIS — I10 Essential (primary) hypertension: Secondary | ICD-10-CM | POA: Diagnosis not present

## 2018-10-04 DIAGNOSIS — K5909 Other constipation: Secondary | ICD-10-CM | POA: Diagnosis not present

## 2018-10-05 DIAGNOSIS — F411 Generalized anxiety disorder: Secondary | ICD-10-CM | POA: Diagnosis not present

## 2018-10-07 DIAGNOSIS — F329 Major depressive disorder, single episode, unspecified: Secondary | ICD-10-CM | POA: Diagnosis not present

## 2018-10-07 DIAGNOSIS — Z471 Aftercare following joint replacement surgery: Secondary | ICD-10-CM | POA: Diagnosis not present

## 2018-10-07 DIAGNOSIS — Z96651 Presence of right artificial knee joint: Secondary | ICD-10-CM | POA: Diagnosis not present

## 2018-10-07 DIAGNOSIS — I10 Essential (primary) hypertension: Secondary | ICD-10-CM | POA: Diagnosis not present

## 2018-10-07 DIAGNOSIS — M5116 Intervertebral disc disorders with radiculopathy, lumbar region: Secondary | ICD-10-CM | POA: Diagnosis not present

## 2018-10-07 DIAGNOSIS — Z9181 History of falling: Secondary | ICD-10-CM | POA: Diagnosis not present

## 2018-10-07 DIAGNOSIS — Z6833 Body mass index (BMI) 33.0-33.9, adult: Secondary | ICD-10-CM | POA: Diagnosis not present

## 2018-10-07 DIAGNOSIS — F419 Anxiety disorder, unspecified: Secondary | ICD-10-CM | POA: Diagnosis not present

## 2018-10-07 DIAGNOSIS — K5909 Other constipation: Secondary | ICD-10-CM | POA: Diagnosis not present

## 2018-10-07 DIAGNOSIS — Z7982 Long term (current) use of aspirin: Secondary | ICD-10-CM | POA: Diagnosis not present

## 2018-10-07 DIAGNOSIS — K219 Gastro-esophageal reflux disease without esophagitis: Secondary | ICD-10-CM | POA: Diagnosis not present

## 2018-10-07 DIAGNOSIS — E669 Obesity, unspecified: Secondary | ICD-10-CM | POA: Diagnosis not present

## 2018-10-11 DIAGNOSIS — Z9181 History of falling: Secondary | ICD-10-CM | POA: Diagnosis not present

## 2018-10-11 DIAGNOSIS — Z96651 Presence of right artificial knee joint: Secondary | ICD-10-CM | POA: Diagnosis not present

## 2018-10-11 DIAGNOSIS — M5116 Intervertebral disc disorders with radiculopathy, lumbar region: Secondary | ICD-10-CM | POA: Diagnosis not present

## 2018-10-11 DIAGNOSIS — Z471 Aftercare following joint replacement surgery: Secondary | ICD-10-CM | POA: Diagnosis not present

## 2018-10-11 DIAGNOSIS — Z7982 Long term (current) use of aspirin: Secondary | ICD-10-CM | POA: Diagnosis not present

## 2018-10-11 DIAGNOSIS — F419 Anxiety disorder, unspecified: Secondary | ICD-10-CM | POA: Diagnosis not present

## 2018-10-11 DIAGNOSIS — F329 Major depressive disorder, single episode, unspecified: Secondary | ICD-10-CM | POA: Diagnosis not present

## 2018-10-11 DIAGNOSIS — K219 Gastro-esophageal reflux disease without esophagitis: Secondary | ICD-10-CM | POA: Diagnosis not present

## 2018-10-11 DIAGNOSIS — K5909 Other constipation: Secondary | ICD-10-CM | POA: Diagnosis not present

## 2018-10-11 DIAGNOSIS — E669 Obesity, unspecified: Secondary | ICD-10-CM | POA: Diagnosis not present

## 2018-10-11 DIAGNOSIS — Z6833 Body mass index (BMI) 33.0-33.9, adult: Secondary | ICD-10-CM | POA: Diagnosis not present

## 2018-10-11 DIAGNOSIS — I10 Essential (primary) hypertension: Secondary | ICD-10-CM | POA: Diagnosis not present

## 2018-10-13 DIAGNOSIS — I10 Essential (primary) hypertension: Secondary | ICD-10-CM | POA: Diagnosis not present

## 2018-10-13 DIAGNOSIS — E669 Obesity, unspecified: Secondary | ICD-10-CM | POA: Diagnosis not present

## 2018-10-13 DIAGNOSIS — F329 Major depressive disorder, single episode, unspecified: Secondary | ICD-10-CM | POA: Diagnosis not present

## 2018-10-13 DIAGNOSIS — Z6833 Body mass index (BMI) 33.0-33.9, adult: Secondary | ICD-10-CM | POA: Diagnosis not present

## 2018-10-13 DIAGNOSIS — M5116 Intervertebral disc disorders with radiculopathy, lumbar region: Secondary | ICD-10-CM | POA: Diagnosis not present

## 2018-10-13 DIAGNOSIS — Z7982 Long term (current) use of aspirin: Secondary | ICD-10-CM | POA: Diagnosis not present

## 2018-10-13 DIAGNOSIS — K219 Gastro-esophageal reflux disease without esophagitis: Secondary | ICD-10-CM | POA: Diagnosis not present

## 2018-10-13 DIAGNOSIS — Z9181 History of falling: Secondary | ICD-10-CM | POA: Diagnosis not present

## 2018-10-13 DIAGNOSIS — Z471 Aftercare following joint replacement surgery: Secondary | ICD-10-CM | POA: Diagnosis not present

## 2018-10-13 DIAGNOSIS — F419 Anxiety disorder, unspecified: Secondary | ICD-10-CM | POA: Diagnosis not present

## 2018-10-13 DIAGNOSIS — K5909 Other constipation: Secondary | ICD-10-CM | POA: Diagnosis not present

## 2018-10-13 DIAGNOSIS — Z96651 Presence of right artificial knee joint: Secondary | ICD-10-CM | POA: Diagnosis not present

## 2018-10-18 DIAGNOSIS — M545 Low back pain: Secondary | ICD-10-CM | POA: Diagnosis not present

## 2018-10-18 DIAGNOSIS — M5416 Radiculopathy, lumbar region: Secondary | ICD-10-CM | POA: Diagnosis not present

## 2018-10-19 DIAGNOSIS — M5116 Intervertebral disc disorders with radiculopathy, lumbar region: Secondary | ICD-10-CM | POA: Diagnosis not present

## 2018-10-19 DIAGNOSIS — E669 Obesity, unspecified: Secondary | ICD-10-CM | POA: Diagnosis not present

## 2018-10-19 DIAGNOSIS — Z7982 Long term (current) use of aspirin: Secondary | ICD-10-CM | POA: Diagnosis not present

## 2018-10-19 DIAGNOSIS — Z6833 Body mass index (BMI) 33.0-33.9, adult: Secondary | ICD-10-CM | POA: Diagnosis not present

## 2018-10-19 DIAGNOSIS — K5909 Other constipation: Secondary | ICD-10-CM | POA: Diagnosis not present

## 2018-10-19 DIAGNOSIS — F419 Anxiety disorder, unspecified: Secondary | ICD-10-CM | POA: Diagnosis not present

## 2018-10-19 DIAGNOSIS — Z96651 Presence of right artificial knee joint: Secondary | ICD-10-CM | POA: Diagnosis not present

## 2018-10-19 DIAGNOSIS — F411 Generalized anxiety disorder: Secondary | ICD-10-CM | POA: Diagnosis not present

## 2018-10-19 DIAGNOSIS — I10 Essential (primary) hypertension: Secondary | ICD-10-CM | POA: Diagnosis not present

## 2018-10-19 DIAGNOSIS — K219 Gastro-esophageal reflux disease without esophagitis: Secondary | ICD-10-CM | POA: Diagnosis not present

## 2018-10-19 DIAGNOSIS — Z9181 History of falling: Secondary | ICD-10-CM | POA: Diagnosis not present

## 2018-10-19 DIAGNOSIS — F329 Major depressive disorder, single episode, unspecified: Secondary | ICD-10-CM | POA: Diagnosis not present

## 2018-10-19 DIAGNOSIS — Z471 Aftercare following joint replacement surgery: Secondary | ICD-10-CM | POA: Diagnosis not present

## 2019-04-17 ENCOUNTER — Other Ambulatory Visit: Payer: Self-pay | Admitting: Family Medicine

## 2019-04-17 DIAGNOSIS — Z1231 Encounter for screening mammogram for malignant neoplasm of breast: Secondary | ICD-10-CM

## 2019-04-20 ENCOUNTER — Ambulatory Visit: Payer: Federal, State, Local not specified - PPO

## 2019-05-04 DIAGNOSIS — J069 Acute upper respiratory infection, unspecified: Secondary | ICD-10-CM | POA: Diagnosis not present

## 2019-07-22 DIAGNOSIS — H04123 Dry eye syndrome of bilateral lacrimal glands: Secondary | ICD-10-CM | POA: Diagnosis not present

## 2019-07-22 DIAGNOSIS — H40033 Anatomical narrow angle, bilateral: Secondary | ICD-10-CM | POA: Diagnosis not present

## 2020-01-05 ENCOUNTER — Other Ambulatory Visit: Payer: Self-pay

## 2020-01-05 ENCOUNTER — Other Ambulatory Visit: Payer: Federal, State, Local not specified - PPO

## 2020-01-05 DIAGNOSIS — Z20822 Contact with and (suspected) exposure to covid-19: Secondary | ICD-10-CM | POA: Diagnosis not present

## 2020-01-06 LAB — SPECIMEN STATUS REPORT

## 2020-01-06 LAB — NOVEL CORONAVIRUS, NAA: SARS-CoV-2, NAA: NOT DETECTED

## 2020-01-10 DIAGNOSIS — I10 Essential (primary) hypertension: Secondary | ICD-10-CM | POA: Diagnosis not present

## 2020-01-10 DIAGNOSIS — E7800 Pure hypercholesterolemia, unspecified: Secondary | ICD-10-CM | POA: Diagnosis not present

## 2020-01-10 DIAGNOSIS — F419 Anxiety disorder, unspecified: Secondary | ICD-10-CM | POA: Diagnosis not present

## 2020-03-18 DIAGNOSIS — F411 Generalized anxiety disorder: Secondary | ICD-10-CM | POA: Diagnosis not present

## 2020-04-22 DIAGNOSIS — F411 Generalized anxiety disorder: Secondary | ICD-10-CM | POA: Diagnosis not present

## 2020-07-02 DIAGNOSIS — H43393 Other vitreous opacities, bilateral: Secondary | ICD-10-CM | POA: Diagnosis not present

## 2020-07-02 DIAGNOSIS — H40033 Anatomical narrow angle, bilateral: Secondary | ICD-10-CM | POA: Diagnosis not present

## 2020-07-10 DIAGNOSIS — E669 Obesity, unspecified: Secondary | ICD-10-CM | POA: Diagnosis not present

## 2020-07-10 DIAGNOSIS — K5904 Chronic idiopathic constipation: Secondary | ICD-10-CM | POA: Diagnosis not present

## 2020-07-10 DIAGNOSIS — F419 Anxiety disorder, unspecified: Secondary | ICD-10-CM | POA: Diagnosis not present

## 2020-07-10 DIAGNOSIS — E7800 Pure hypercholesterolemia, unspecified: Secondary | ICD-10-CM | POA: Diagnosis not present

## 2020-07-10 DIAGNOSIS — I10 Essential (primary) hypertension: Secondary | ICD-10-CM | POA: Diagnosis not present

## 2020-07-12 DIAGNOSIS — H2513 Age-related nuclear cataract, bilateral: Secondary | ICD-10-CM | POA: Diagnosis not present

## 2020-07-12 DIAGNOSIS — H26491 Other secondary cataract, right eye: Secondary | ICD-10-CM | POA: Diagnosis not present

## 2020-07-12 DIAGNOSIS — H209 Unspecified iridocyclitis: Secondary | ICD-10-CM | POA: Diagnosis not present

## 2020-07-12 DIAGNOSIS — Z961 Presence of intraocular lens: Secondary | ICD-10-CM | POA: Diagnosis not present

## 2020-07-12 DIAGNOSIS — H26492 Other secondary cataract, left eye: Secondary | ICD-10-CM | POA: Diagnosis not present

## 2020-07-26 DIAGNOSIS — H26491 Other secondary cataract, right eye: Secondary | ICD-10-CM | POA: Diagnosis not present

## 2020-08-05 DIAGNOSIS — H2513 Age-related nuclear cataract, bilateral: Secondary | ICD-10-CM | POA: Diagnosis not present

## 2020-08-27 DIAGNOSIS — H43393 Other vitreous opacities, bilateral: Secondary | ICD-10-CM | POA: Diagnosis not present

## 2020-09-17 DIAGNOSIS — L239 Allergic contact dermatitis, unspecified cause: Secondary | ICD-10-CM | POA: Diagnosis not present

## 2020-09-17 DIAGNOSIS — L821 Other seborrheic keratosis: Secondary | ICD-10-CM | POA: Diagnosis not present

## 2020-10-29 DIAGNOSIS — F411 Generalized anxiety disorder: Secondary | ICD-10-CM | POA: Diagnosis not present

## 2020-11-12 DIAGNOSIS — F411 Generalized anxiety disorder: Secondary | ICD-10-CM | POA: Diagnosis not present

## 2020-12-24 DIAGNOSIS — F411 Generalized anxiety disorder: Secondary | ICD-10-CM | POA: Diagnosis not present

## 2021-01-10 DIAGNOSIS — F411 Generalized anxiety disorder: Secondary | ICD-10-CM | POA: Diagnosis not present

## 2021-01-23 DIAGNOSIS — Z1231 Encounter for screening mammogram for malignant neoplasm of breast: Secondary | ICD-10-CM | POA: Diagnosis not present

## 2021-01-23 DIAGNOSIS — M1712 Unilateral primary osteoarthritis, left knee: Secondary | ICD-10-CM | POA: Diagnosis not present

## 2021-01-23 DIAGNOSIS — Z1211 Encounter for screening for malignant neoplasm of colon: Secondary | ICD-10-CM | POA: Diagnosis not present

## 2021-01-23 DIAGNOSIS — K5730 Diverticulosis of large intestine without perforation or abscess without bleeding: Secondary | ICD-10-CM | POA: Diagnosis not present

## 2021-01-23 DIAGNOSIS — I10 Essential (primary) hypertension: Secondary | ICD-10-CM | POA: Diagnosis not present

## 2021-01-23 DIAGNOSIS — K5904 Chronic idiopathic constipation: Secondary | ICD-10-CM | POA: Diagnosis not present

## 2021-01-24 DIAGNOSIS — F411 Generalized anxiety disorder: Secondary | ICD-10-CM | POA: Diagnosis not present

## 2021-02-05 DIAGNOSIS — F411 Generalized anxiety disorder: Secondary | ICD-10-CM | POA: Diagnosis not present

## 2021-03-04 IMAGING — DX PORTABLE RIGHT KNEE - 1-2 VIEW
2 series · 2 of 2 positions shown · non-contrast
Comparison: None.

CLINICAL DATA: 62-year-old female post right knee replacement.
Initial encounter.

EXAM:
PORTABLE RIGHT KNEE - 1-2 VIEW

[knee ap]
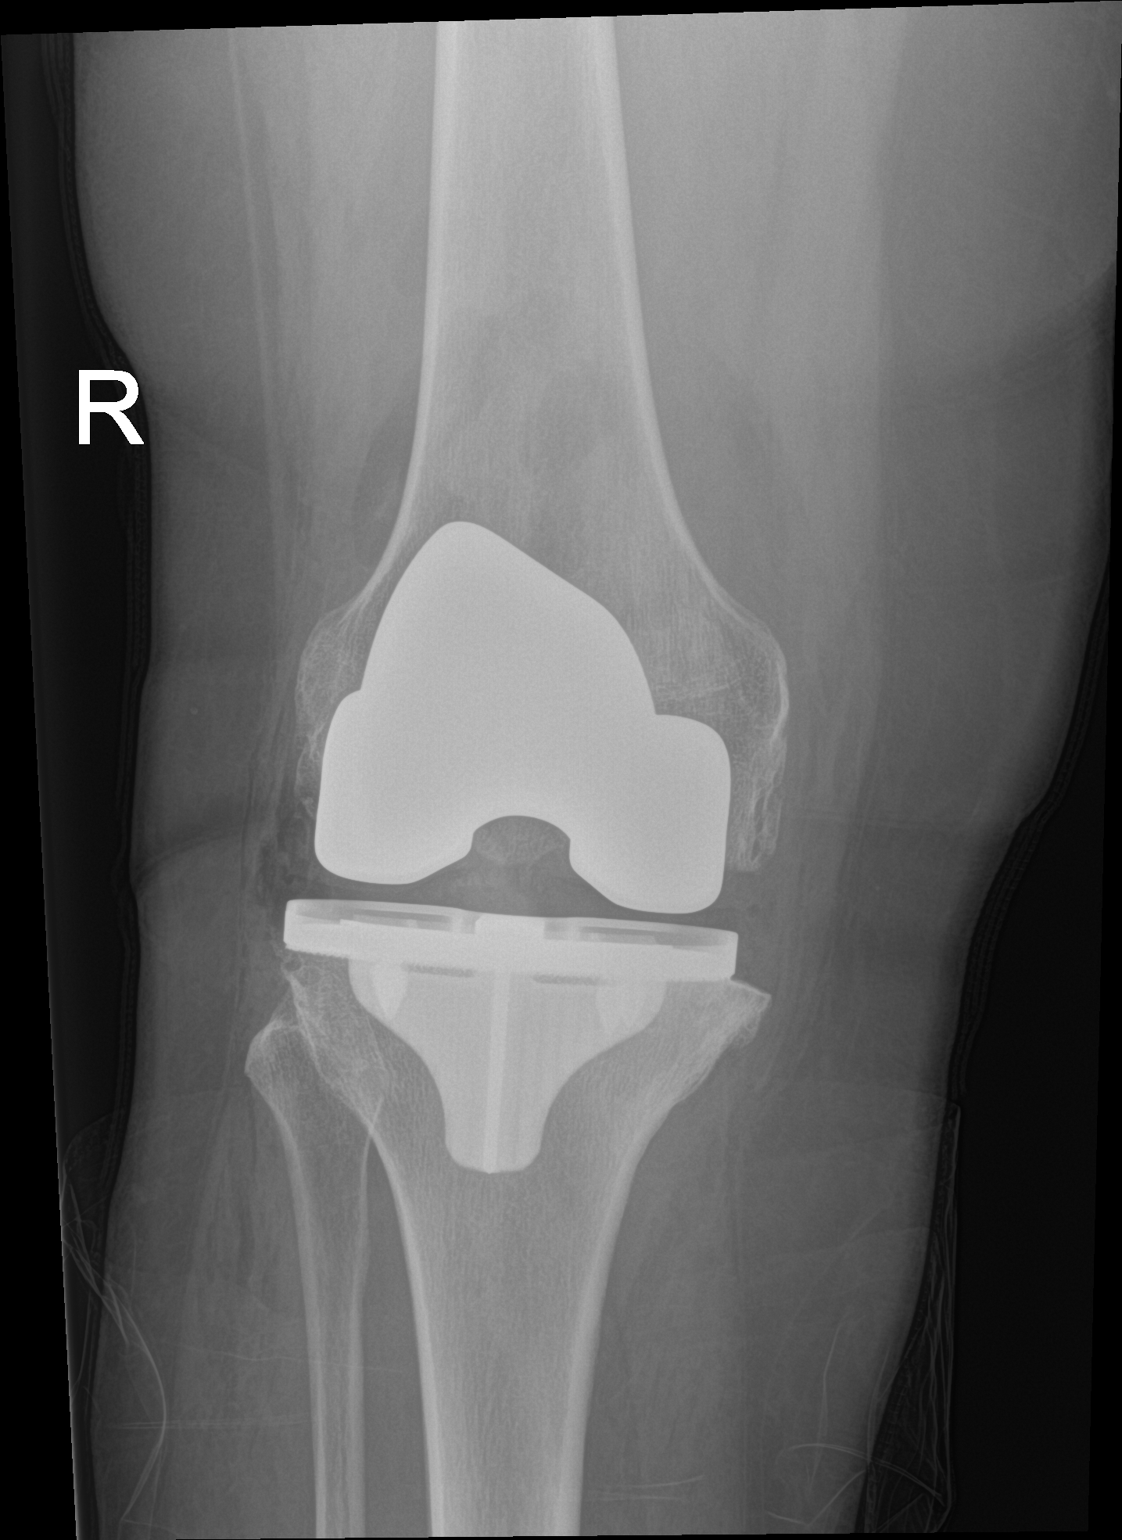

[knee lat]
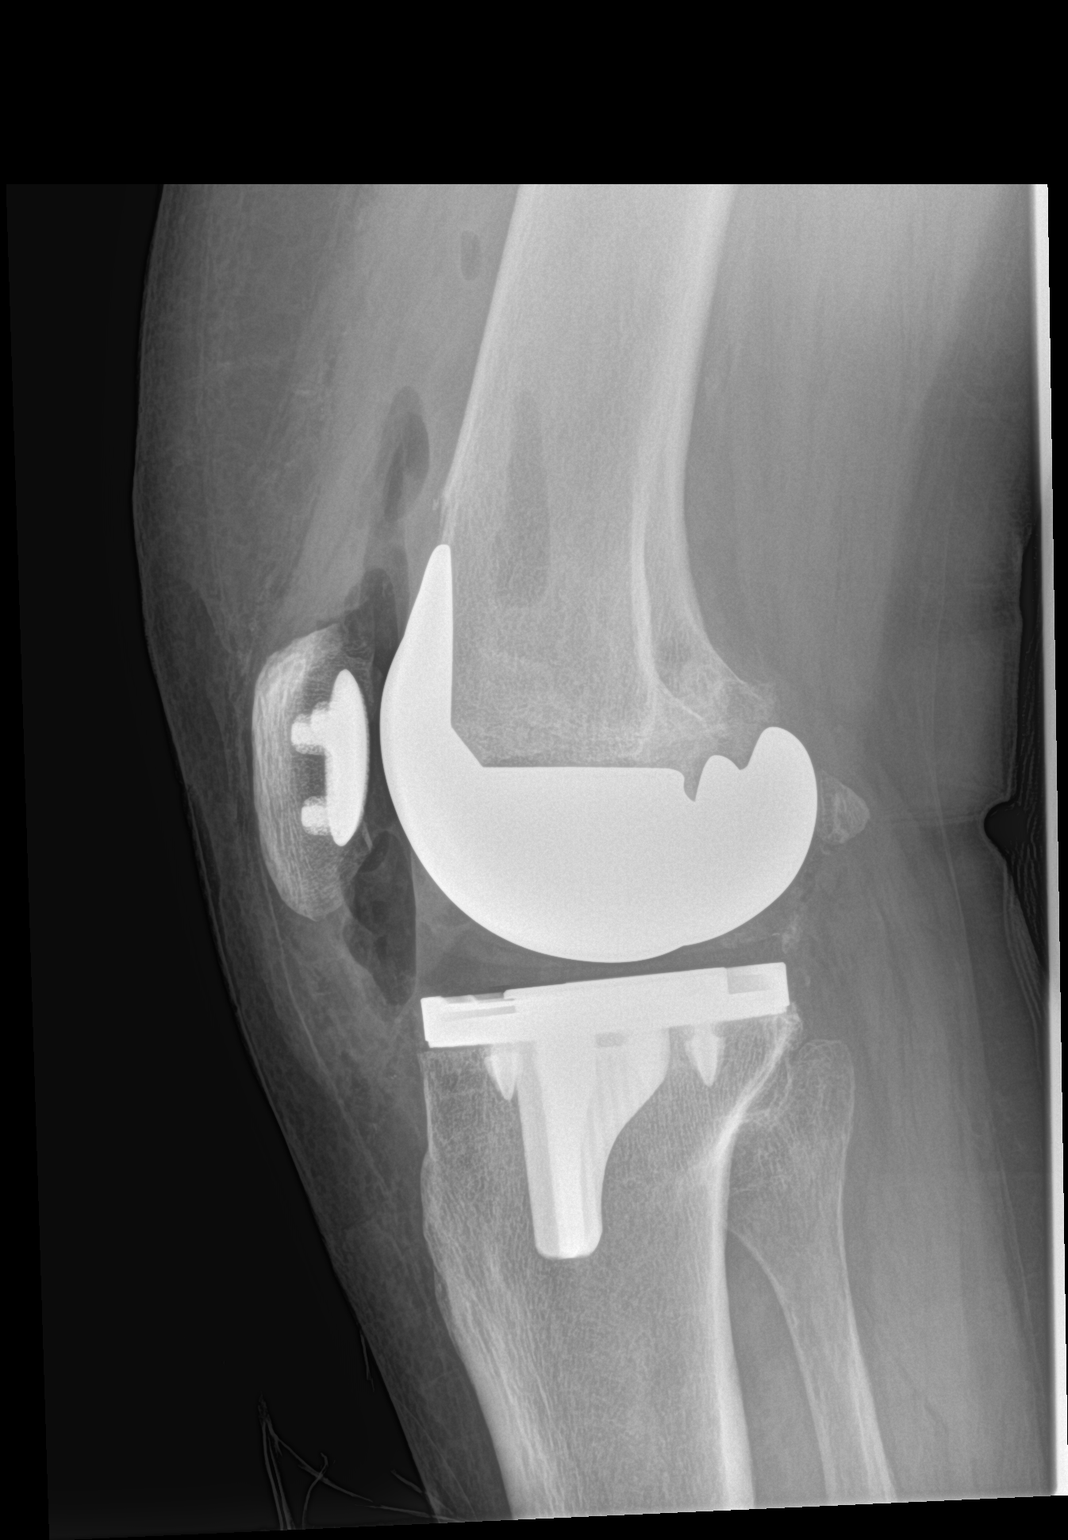

[2 of 2 positions shown; findings below may reference images not displayed]

FINDINGS: Post right knee replacement which appears in satisfactory position
without complication noted.
IMPRESSION: Post right knee replacement.

## 2021-03-07 DIAGNOSIS — F411 Generalized anxiety disorder: Secondary | ICD-10-CM | POA: Diagnosis not present

## 2021-03-12 DIAGNOSIS — F411 Generalized anxiety disorder: Secondary | ICD-10-CM | POA: Diagnosis not present

## 2021-03-19 DIAGNOSIS — F411 Generalized anxiety disorder: Secondary | ICD-10-CM | POA: Diagnosis not present

## 2021-04-02 DIAGNOSIS — F411 Generalized anxiety disorder: Secondary | ICD-10-CM | POA: Diagnosis not present

## 2021-04-09 DIAGNOSIS — F411 Generalized anxiety disorder: Secondary | ICD-10-CM | POA: Diagnosis not present

## 2021-04-16 DIAGNOSIS — F411 Generalized anxiety disorder: Secondary | ICD-10-CM | POA: Diagnosis not present

## 2021-04-24 DIAGNOSIS — E7800 Pure hypercholesterolemia, unspecified: Secondary | ICD-10-CM | POA: Diagnosis not present

## 2021-04-24 DIAGNOSIS — Z0000 Encounter for general adult medical examination without abnormal findings: Secondary | ICD-10-CM | POA: Diagnosis not present

## 2021-04-24 DIAGNOSIS — I10 Essential (primary) hypertension: Secondary | ICD-10-CM | POA: Diagnosis not present

## 2021-04-24 DIAGNOSIS — Z23 Encounter for immunization: Secondary | ICD-10-CM | POA: Diagnosis not present

## 2021-05-20 ENCOUNTER — Other Ambulatory Visit: Payer: Self-pay | Admitting: Internal Medicine

## 2021-05-20 DIAGNOSIS — Z1231 Encounter for screening mammogram for malignant neoplasm of breast: Secondary | ICD-10-CM

## 2021-05-20 DIAGNOSIS — R062 Wheezing: Secondary | ICD-10-CM | POA: Diagnosis not present

## 2021-05-20 DIAGNOSIS — Z8616 Personal history of COVID-19: Secondary | ICD-10-CM | POA: Diagnosis not present

## 2021-05-20 DIAGNOSIS — R059 Cough, unspecified: Secondary | ICD-10-CM | POA: Diagnosis not present

## 2021-05-20 DIAGNOSIS — I10 Essential (primary) hypertension: Secondary | ICD-10-CM | POA: Diagnosis not present

## 2021-05-20 DIAGNOSIS — Z13820 Encounter for screening for osteoporosis: Secondary | ICD-10-CM

## 2021-05-23 DIAGNOSIS — R059 Cough, unspecified: Secondary | ICD-10-CM | POA: Diagnosis not present

## 2021-05-23 DIAGNOSIS — I10 Essential (primary) hypertension: Secondary | ICD-10-CM | POA: Diagnosis not present

## 2021-05-23 DIAGNOSIS — Z8616 Personal history of COVID-19: Secondary | ICD-10-CM | POA: Diagnosis not present

## 2021-05-23 DIAGNOSIS — R062 Wheezing: Secondary | ICD-10-CM | POA: Diagnosis not present

## 2021-06-11 DIAGNOSIS — Z20822 Contact with and (suspected) exposure to covid-19: Secondary | ICD-10-CM | POA: Diagnosis not present

## 2021-06-11 DIAGNOSIS — R059 Cough, unspecified: Secondary | ICD-10-CM | POA: Diagnosis not present

## 2021-06-11 DIAGNOSIS — R062 Wheezing: Secondary | ICD-10-CM | POA: Diagnosis not present

## 2021-06-12 DIAGNOSIS — Z1211 Encounter for screening for malignant neoplasm of colon: Secondary | ICD-10-CM | POA: Diagnosis not present

## 2021-06-12 DIAGNOSIS — K5730 Diverticulosis of large intestine without perforation or abscess without bleeding: Secondary | ICD-10-CM | POA: Diagnosis not present

## 2021-06-12 DIAGNOSIS — E669 Obesity, unspecified: Secondary | ICD-10-CM | POA: Diagnosis not present

## 2021-06-12 DIAGNOSIS — K5904 Chronic idiopathic constipation: Secondary | ICD-10-CM | POA: Diagnosis not present

## 2021-06-12 DIAGNOSIS — Z86010 Personal history of colonic polyps: Secondary | ICD-10-CM | POA: Diagnosis not present

## 2021-08-18 DIAGNOSIS — D122 Benign neoplasm of ascending colon: Secondary | ICD-10-CM | POA: Diagnosis not present

## 2021-08-18 DIAGNOSIS — K649 Unspecified hemorrhoids: Secondary | ICD-10-CM | POA: Diagnosis not present

## 2021-08-18 DIAGNOSIS — K5730 Diverticulosis of large intestine without perforation or abscess without bleeding: Secondary | ICD-10-CM | POA: Diagnosis not present

## 2021-08-18 DIAGNOSIS — Z1211 Encounter for screening for malignant neoplasm of colon: Secondary | ICD-10-CM | POA: Diagnosis not present

## 2021-08-18 DIAGNOSIS — K635 Polyp of colon: Secondary | ICD-10-CM | POA: Diagnosis not present

## 2021-09-23 DIAGNOSIS — F411 Generalized anxiety disorder: Secondary | ICD-10-CM | POA: Diagnosis not present

## 2021-10-15 ENCOUNTER — Ambulatory Visit
Admission: RE | Admit: 2021-10-15 | Discharge: 2021-10-15 | Disposition: A | Discharge status: Home / Self Care | Payer: Medicare HMO | Source: Ambulatory Visit | Attending: Internal Medicine | Admitting: Internal Medicine

## 2021-10-15 ENCOUNTER — Ambulatory Visit: Payer: Federal, State, Local not specified - PPO

## 2021-10-15 DIAGNOSIS — Z780 Asymptomatic menopausal state: Secondary | ICD-10-CM | POA: Diagnosis not present

## 2021-10-15 DIAGNOSIS — Z13820 Encounter for screening for osteoporosis: Secondary | ICD-10-CM

## 2021-10-16 DIAGNOSIS — F411 Generalized anxiety disorder: Secondary | ICD-10-CM | POA: Diagnosis not present

## 2021-10-20 ENCOUNTER — Ambulatory Visit
Admission: RE | Admit: 2021-10-20 | Discharge: 2021-10-20 | Disposition: A | Discharge status: Home / Self Care | Payer: Federal, State, Local not specified - PPO | Source: Ambulatory Visit | Attending: Internal Medicine | Admitting: Internal Medicine

## 2021-10-20 DIAGNOSIS — Z1231 Encounter for screening mammogram for malignant neoplasm of breast: Secondary | ICD-10-CM | POA: Diagnosis not present

## 2021-10-23 DIAGNOSIS — K5904 Chronic idiopathic constipation: Secondary | ICD-10-CM | POA: Diagnosis not present

## 2021-10-23 DIAGNOSIS — E7800 Pure hypercholesterolemia, unspecified: Secondary | ICD-10-CM | POA: Diagnosis not present

## 2021-10-23 DIAGNOSIS — F419 Anxiety disorder, unspecified: Secondary | ICD-10-CM | POA: Diagnosis not present

## 2021-10-23 DIAGNOSIS — I10 Essential (primary) hypertension: Secondary | ICD-10-CM | POA: Diagnosis not present

## 2021-11-07 DIAGNOSIS — F411 Generalized anxiety disorder: Secondary | ICD-10-CM | POA: Diagnosis not present

## 2022-01-14 DIAGNOSIS — K5904 Chronic idiopathic constipation: Secondary | ICD-10-CM | POA: Diagnosis not present

## 2022-01-14 DIAGNOSIS — H538 Other visual disturbances: Secondary | ICD-10-CM | POA: Diagnosis not present

## 2022-01-14 DIAGNOSIS — F419 Anxiety disorder, unspecified: Secondary | ICD-10-CM | POA: Diagnosis not present

## 2022-01-14 DIAGNOSIS — M21611 Bunion of right foot: Secondary | ICD-10-CM | POA: Diagnosis not present

## 2022-01-16 DIAGNOSIS — H43393 Other vitreous opacities, bilateral: Secondary | ICD-10-CM | POA: Diagnosis not present

## 2022-01-26 ENCOUNTER — Ambulatory Visit (INDEPENDENT_AMBULATORY_CARE_PROVIDER_SITE_OTHER): Payer: Federal, State, Local not specified - PPO

## 2022-01-26 ENCOUNTER — Ambulatory Visit (INDEPENDENT_AMBULATORY_CARE_PROVIDER_SITE_OTHER): Payer: Federal, State, Local not specified - PPO | Admitting: Podiatry

## 2022-01-26 DIAGNOSIS — M21611 Bunion of right foot: Secondary | ICD-10-CM | POA: Diagnosis not present

## 2022-01-26 DIAGNOSIS — M21612 Bunion of left foot: Secondary | ICD-10-CM | POA: Diagnosis not present

## 2022-01-26 DIAGNOSIS — M21619 Bunion of unspecified foot: Secondary | ICD-10-CM

## 2022-01-26 DIAGNOSIS — L600 Ingrowing nail: Secondary | ICD-10-CM | POA: Diagnosis not present

## 2022-01-26 MED ORDER — CEPHALEXIN 500 MG PO CAPS: 500.0000 mg | ORAL_CAPSULE | Freq: Three times a day (TID) | ORAL | 0 refills | Status: AC

## 2022-01-26 NOTE — Patient Instructions (Signed)

## 2022-01-26 NOTE — Progress Notes (Signed)
Subjective:   Patient ID: Danielle Anthony, female   DOB: 66 y.o.   MRN: [id number]   HPI Chief Complaint  Patient presents with   Bunions    Patient came in with right foot bunion and left foot 3 rd pain, started a month ago, rate of pain 7 out of 10, pressure and numbness, X-Rays taken today    66 year old female with the above concerns.  She states  it always hurts on the side of the left 3rd toe. The nail has started growing irregular. She may have a piece of nail in the side of it.   She had bunion pain on the left foot and she had surgery on that. The right has been causing pian for "awhile". Over the last 2-3 months the big toe on the right is starting to get numb. It comes and goes. Currently not having the symptoms but it is early and has not walked much today. No radiating pain.   Review of Systems  All other systems reviewed and are negative.  Past Medical History:  Diagnosis Date   Anxiety    Chronic constipation    Complication of anesthesia    Depression    GERD (gastroesophageal reflux disease)    Hypertension     Past Surgical History:  Procedure Laterality Date   ABDOMINAL HYSTERECTOMY     BREAST SURGERY     COLON SURGERY     COLONOSCOPY     EYE SURGERY Bilateral 2018   Cataract   FOOT SURGERY     KNEE ARTHROPLASTY     TOTAL KNEE ARTHROPLASTY Right 07/12/2018   Procedure: TOTAL KNEE ARTHROPLASTY;  Surgeon: Renette Butters, MD;  Location: [facility];  Service: Orthopedics;  Laterality: Right;     Current Outpatient Medications:    albuterol (PROVENTIL HFA;VENTOLIN HFA) 108 (90 Base) MCG/ACT inhaler, Inhale 2 puffs into the lungs every 6 (six) hours as needed for wheezing or shortness of breath., Disp: 1 Inhaler, Rfl: 0   amLODipine (NORVASC) 10 MG tablet, TAKE 1 TABLET (10 MG TOTAL) BY MOUTH DAILY. (Patient taking differently: Take 10 mg by mouth daily. ), Disp: 30 tablet, Rfl: 2   aspirin EC 81 MG tablet, Take 1 tablet (81 mg total) by mouth 2 (two) times  daily. For DVT prophylaxis for 30 days after surgery., Disp: 60 tablet, Rfl: 0   atorvastatin (LIPITOR) 40 MG tablet, Take 40 mg by mouth at bedtime., Disp: , Rfl:    Carboxymethylcellulose Sodium (THERATEARS) 0.25 % SOLN, Place 1-2 drops into both eyes every 2 (two) hours as needed (for dry/irritated eyes.)., Disp: , Rfl:    citalopram (CELEXA) 20 MG tablet, Take 20 mg by mouth daily., Disp: , Rfl:    clonazePAM (KLONOPIN) 0.5 MG tablet, Take 0.5 mg by mouth 2 (two) times daily as needed for anxiety., Disp: , Rfl:    diphenhydrAMINE (EQ ALLERGY RELIEF) 25 MG tablet, Take 25 mg by mouth at bedtime., Disp: , Rfl:    hydrochlorothiazide (HYDRODIURIL) 25 MG tablet, TAKE 1 TABLET (25 MG TOTAL) BY MOUTH DAILY. (Patient taking differently: Take 25 mg by mouth daily. ), Disp: 30 tablet, Rfl: 1   omeprazole (PRILOSEC) 20 MG capsule, Take 1 capsule (20 mg total) by mouth daily. 30 days for gastroprotection while taking NSAIDs., Disp: 30 capsule, Rfl: 0   Probiotic Product (PROBIOTIC PO), Take 2 capsules by mouth daily., Disp: , Rfl:    TRULANCE 3 MG TABS, Take 1 tablet by mouth daily., Disp: ,  Rfl:   Allergies  Allergen Reactions   Blood-Group Specific Substance     No blood products   Lisinopril Swelling          Objective:  Physical Exam  General: AAO x3, NAD  Dermatological: Incurvation present to the lateral aspect the left third toenail with tenderness palpation localized edema there is no erythema or warmth.  There is no drainage or pus.  No ascending cellulitis.  Vascular: Dorsalis Pedis artery and Posterior Tibial artery pedal pulses are 2/4 bilateral with immedate capillary fill time. There is no pain with calf compression, swelling, warmth, erythema.   Neruologic: Grossly intact via light touch bilateral.   Musculoskeletal: Tenderness palpation left third lateral nail border.  Also tenderness palpation on the area of bunion the right foot which is severe in nature.  No area pinpoint  tenderness otherwise.  Muscular strength 5/5 in all groups tested bilateral.  Gait: Unassisted, Nonantalgic.       Assessment:   Ingrown toenail left 3rd toe lateral border; right foot arthritic bunion      Plan:  -Treatment options discussed including all alternatives, risks, and complications -Etiology of symptoms were discussed -X-rays were obtained and reviewed with the patient.  Arthritic changes present of the left first MPJ.  Severe bunion present on the right foot.  No evidence of acute fracture.  Midfoot arthrosis present on the right foot. -Regards to the plan we discussed both conservative as well as visual treatment options.  After discussion she was proceed with conservative care.  Discussed treatment occasions and offloading. -At this time, the patient is requesting partial nail removal with chemical matricectomy to the symptomatic portion of the nail. Risks and complications were discussed with the patient for which they understand and written consent was obtained. Under sterile conditions a total of 3 mL of a mixture of 2% lidocaine plain and 0.5% Marcaine plain was infiltrated in a digital block fashion. Once anesthetized, the skin was prepped in sterile fashion. A tourniquet was then applied. Next the lateral aspect of left third digit nail border was then sharply excised making sure to remove the entire offending nail border. Once the nails were ensured to be removed area was debrided and the underlying skin was intact. There is no purulence identified in the procedure. Next phenol was then applied under standard conditions and copiously irrigated. Silvadene was applied. A dry sterile dressing was applied. After application of the dressing the tourniquet was removed and there is found to be an immediate capillary refill time to the digit. The patient tolerated the procedure well any complications. Post procedure instructions were discussed the patient for which he verbally  understood. Discussed signs/symptoms of infection and directed to call the office immediately should any occur or go directly to the emergency room. In the meantime, encouraged to call the office with any questions, concerns, changes symptoms. -Keflex  Trula Slade DPM

## 2022-02-10 DIAGNOSIS — H43811 Vitreous degeneration, right eye: Secondary | ICD-10-CM | POA: Diagnosis not present

## 2022-02-26 DIAGNOSIS — F411 Generalized anxiety disorder: Secondary | ICD-10-CM | POA: Diagnosis not present

## 2022-05-14 DIAGNOSIS — M5136 Other intervertebral disc degeneration, lumbar region: Secondary | ICD-10-CM | POA: Diagnosis not present

## 2022-05-14 DIAGNOSIS — E7800 Pure hypercholesterolemia, unspecified: Secondary | ICD-10-CM | POA: Diagnosis not present

## 2022-05-14 DIAGNOSIS — E669 Obesity, unspecified: Secondary | ICD-10-CM | POA: Diagnosis not present

## 2022-05-14 DIAGNOSIS — Z0000 Encounter for general adult medical examination without abnormal findings: Secondary | ICD-10-CM | POA: Diagnosis not present

## 2022-05-14 DIAGNOSIS — I10 Essential (primary) hypertension: Secondary | ICD-10-CM | POA: Diagnosis not present

## 2022-05-14 DIAGNOSIS — K5904 Chronic idiopathic constipation: Secondary | ICD-10-CM | POA: Diagnosis not present

## 2022-05-14 DIAGNOSIS — F418 Other specified anxiety disorders: Secondary | ICD-10-CM | POA: Diagnosis not present

## 2022-05-14 DIAGNOSIS — M1712 Unilateral primary osteoarthritis, left knee: Secondary | ICD-10-CM | POA: Diagnosis not present

## 2022-05-15 DIAGNOSIS — Z0000 Encounter for general adult medical examination without abnormal findings: Secondary | ICD-10-CM | POA: Diagnosis not present

## 2022-05-15 DIAGNOSIS — I10 Essential (primary) hypertension: Secondary | ICD-10-CM | POA: Diagnosis not present

## 2022-05-15 DIAGNOSIS — E7800 Pure hypercholesterolemia, unspecified: Secondary | ICD-10-CM | POA: Diagnosis not present

## 2022-05-20 DIAGNOSIS — F411 Generalized anxiety disorder: Secondary | ICD-10-CM | POA: Diagnosis not present

## 2022-06-12 DIAGNOSIS — H43822 Vitreomacular adhesion, left eye: Secondary | ICD-10-CM | POA: Diagnosis not present

## 2022-06-12 DIAGNOSIS — H02401 Unspecified ptosis of right eyelid: Secondary | ICD-10-CM | POA: Diagnosis not present

## 2022-06-12 DIAGNOSIS — H04123 Dry eye syndrome of bilateral lacrimal glands: Secondary | ICD-10-CM | POA: Diagnosis not present

## 2022-06-12 DIAGNOSIS — H43811 Vitreous degeneration, right eye: Secondary | ICD-10-CM | POA: Diagnosis not present

## 2022-07-01 DIAGNOSIS — F411 Generalized anxiety disorder: Secondary | ICD-10-CM | POA: Diagnosis not present

## 2022-07-15 DIAGNOSIS — F411 Generalized anxiety disorder: Secondary | ICD-10-CM | POA: Diagnosis not present

## 2022-07-29 DIAGNOSIS — F411 Generalized anxiety disorder: Secondary | ICD-10-CM | POA: Diagnosis not present

## 2022-08-12 DIAGNOSIS — F411 Generalized anxiety disorder: Secondary | ICD-10-CM | POA: Diagnosis not present

## 2022-08-26 DIAGNOSIS — F411 Generalized anxiety disorder: Secondary | ICD-10-CM | POA: Diagnosis not present

## 2022-09-09 DIAGNOSIS — F411 Generalized anxiety disorder: Secondary | ICD-10-CM | POA: Diagnosis not present

## 2022-09-30 DIAGNOSIS — F411 Generalized anxiety disorder: Secondary | ICD-10-CM | POA: Diagnosis not present

## 2022-10-07 DIAGNOSIS — F411 Generalized anxiety disorder: Secondary | ICD-10-CM | POA: Diagnosis not present

## 2022-10-21 DIAGNOSIS — F411 Generalized anxiety disorder: Secondary | ICD-10-CM | POA: Diagnosis not present

## 2022-11-12 ENCOUNTER — Ambulatory Visit
Admission: EM | Admit: 2022-11-12 | Discharge: 2022-11-12 | Disposition: A | Discharge status: Home / Self Care | Payer: Federal, State, Local not specified - PPO | Attending: Family Medicine | Admitting: Family Medicine

## 2022-11-12 DIAGNOSIS — Z1152 Encounter for screening for COVID-19: Secondary | ICD-10-CM | POA: Diagnosis not present

## 2022-11-12 DIAGNOSIS — R110 Nausea: Secondary | ICD-10-CM

## 2022-11-12 DIAGNOSIS — J22 Unspecified acute lower respiratory infection: Secondary | ICD-10-CM

## 2022-11-12 DIAGNOSIS — U071 COVID-19: Secondary | ICD-10-CM | POA: Insufficient documentation

## 2022-11-12 MED ORDER — IPRATROPIUM-ALBUTEROL 0.5-2.5 (3) MG/3ML IN SOLN
3.0000 mL | Freq: Once | RESPIRATORY_TRACT | Status: AC
  Administered 2022-11-12: 3 mL via RESPIRATORY_TRACT

## 2022-11-12 MED ORDER — AMOXICILLIN-POT CLAVULANATE 875-125 MG PO TABS: 1.0000 | ORAL_TABLET | Freq: Two times a day (BID) | ORAL | 0 refills | Status: AC

## 2022-11-12 MED ORDER — ALBUTEROL SULFATE HFA 108 (90 BASE) MCG/ACT IN AERS: 1.0000 | INHALATION_SPRAY | Freq: Four times a day (QID) | RESPIRATORY_TRACT | 0 refills | Status: AC | PRN

## 2022-11-12 MED ORDER — ALBUTEROL SULFATE HFA 108 (90 BASE) MCG/ACT IN AERS
2.0000 | INHALATION_SPRAY | Freq: Four times a day (QID) | RESPIRATORY_TRACT | Status: DC | PRN
  Administered 2022-11-12: 2 via RESPIRATORY_TRACT

## 2022-11-12 MED ORDER — PROMETHAZINE-DM 6.25-15 MG/5ML PO SYRP: 5.0000 mL | ORAL_SOLUTION | Freq: Four times a day (QID) | ORAL | 0 refills | Status: AC | PRN

## 2022-11-12 MED ORDER — PREDNISONE 20 MG PO TABS: 40.0000 mg | ORAL_TABLET | Freq: Every day | ORAL | 0 refills | Status: DC

## 2022-11-12 MED ORDER — ONDANSETRON 4 MG PO TBDP
4.0000 mg | ORAL_TABLET | Freq: Once | ORAL | Status: AC
  Administered 2022-11-12: 4 mg via ORAL

## 2022-11-12 MED ORDER — ONDANSETRON HCL 4 MG PO TABS: 4.0000 mg | ORAL_TABLET | Freq: Four times a day (QID) | ORAL | 0 refills | Status: AC

## 2022-11-12 NOTE — ED Triage Notes (Signed)
Pt states she is having cough, congestion, headache, lower back pain, SOB with exertion that started a week ago. Taking tylenol and OTC sinus relief.

## 2022-11-12 NOTE — ED Provider Notes (Signed)
[facility]    CSN: [id number] Arrival date & time: 11/12/22  0840      History   Chief Complaint Chief Complaint  Patient presents with   Cough    HPI Danielle Anthony is a 67 y.o. female.   HPI Patient presents with cough, left-sided lower back pain, shortness of breath, nausea x 1 week.  She reports she also had symptoms of a severe headache, generalized bodyaches and generalized weakness which lasted approximately 3 days with an onset of week ago.  Weakness and bodyaches along with headache has subsequently resolved but she is still having shortness of breath along with some chest tightness and a persistent cough, and nausea.  She reports calling the on-call nurse for her insurance company who recommended that she come in and obtain a COVID test given her symptoms.  Patient has no history of asthma however has a history of developing bronchitis.   Past Medical History:  Diagnosis Date   Anxiety    Chronic constipation    Complication of anesthesia    Depression    GERD (gastroesophageal reflux disease)    Hypertension     Patient Active Problem List   Diagnosis Date Noted   Primary localized osteoarthritis of knee 07/12/2018   Primary osteoarthritis of right knee 06/21/2018   Unilateral primary osteoarthritis, right knee 12/11/2016   Obesity 04/22/2014   HTN (hypertension) 08/15/2011    Past Surgical History:  Procedure Laterality Date   ABDOMINAL HYSTERECTOMY     BREAST SURGERY     COLON SURGERY     COLONOSCOPY     EYE SURGERY Bilateral 2018   Cataract   FOOT SURGERY     KNEE ARTHROPLASTY     TOTAL KNEE ARTHROPLASTY Right 07/12/2018   Procedure: TOTAL KNEE ARTHROPLASTY;  Surgeon: Sheral Apley, MD;  Location: [facility];  Service: Orthopedics;  Laterality: Right;    OB History   No obstetric history on file.      Home Medications    Prior to Admission medications   Medication Sig Start Date End Date Taking? Authorizing Provider   albuterol (VENTOLIN HFA) 108 (90 Base) MCG/ACT inhaler Inhale 1-2 puffs into the lungs every 6 (six) hours as needed for wheezing or shortness of breath. 11/12/22  Yes Bing Neighbors, NP  amLODipine (NORVASC) 10 MG tablet TAKE 1 TABLET (10 MG TOTAL) BY MOUTH DAILY. Patient taking differently: Take 10 mg by mouth daily. 12/27/11  Yes Jeffery, Chelle, PA  amoxicillin-clavulanate (AUGMENTIN) 875-125 MG tablet Take 1 tablet by mouth every 12 (twelve) hours for 10 days. 11/12/22 11/22/22 Yes Bing Neighbors, NP  atorvastatin (LIPITOR) 40 MG tablet Take 40 mg by mouth at bedtime. 11/02/21  Yes [provider]  Carboxymethylcellulose Sodium (THERATEARS) 0.25 % SOLN Place 1-2 drops into both eyes every 2 (two) hours as needed (for dry/irritated eyes.).   Yes [provider]  citalopram (CELEXA) 20 MG tablet Take 20 mg by mouth daily.   Yes [provider]  clonazePAM (KLONOPIN) 0.5 MG tablet Take 0.5 mg by mouth 2 (two) times daily as needed for anxiety.   Yes [provider]  diphenhydrAMINE (EQ ALLERGY RELIEF) 25 MG tablet Take 25 mg by mouth at bedtime.   Yes [provider]  hydrochlorothiazide (HYDRODIURIL) 25 MG tablet TAKE 1 TABLET (25 MG TOTAL) BY MOUTH DAILY. Patient taking differently: Take 25 mg by mouth daily. 11/28/11  Yes McClung, Marzella Schlein, PA-C  linaclotide (LINZESS) 290 MCG  CAPS capsule Take 290 mcg by mouth daily before breakfast.   Yes [provider]  ondansetron (ZOFRAN) 4 MG tablet Take 1 tablet (4 mg total) by mouth every 6 (six) hours. 11/12/22  Yes Bing Neighbors, NP  predniSONE (DELTASONE) 20 MG tablet Take 2 tablets (40 mg total) by mouth daily with breakfast. 11/12/22  Yes Bing Neighbors, NP  Probiotic Product (PROBIOTIC PO) Take 2 capsules by mouth daily.   Yes [provider]  promethazine-dextromethorphan (PROMETHAZINE-DM) 6.25-15 MG/5ML syrup Take 5 mLs by mouth 4 (four) times daily as needed for cough.  11/12/22  Yes Bing Neighbors, NP  aspirin EC 81 MG tablet Take 1 tablet (81 mg total) by mouth 2 (two) times daily. For DVT prophylaxis for 30 days after surgery. 07/12/18   Martensen, Lucretia Kern III, PA-C  cephALEXin (KEFLEX) 500 MG capsule Take 1 capsule (500 mg total) by mouth 3 (three) times daily. 01/26/22   Vivi Barrack, DPM  omeprazole (PRILOSEC) 20 MG capsule Take 1 capsule (20 mg total) by mouth daily. 30 days for gastroprotection while taking NSAIDs. 07/12/18   Martensen, Lucretia Kern III, PA-C  TRULANCE 3 MG TABS Take 1 tablet by mouth daily. 01/14/22   [provider]    Family History Family History  Problem Relation Age of Onset   Heart disease Brother     Social History Social History   Tobacco Use   Smoking status: Never   Smokeless tobacco: Never  Vaping Use   Vaping status: Never Used  Substance Use Topics   Alcohol use: Yes    Alcohol/week: 2.0 standard drinks of alcohol    Types: 2 Cans of beer per week    Comment: occas   Drug use: No     Allergies   Blood-group specific substance and Lisinopril   Review of Systems Review of Systems Pertinent negatives listed in HPI  Physical Exam Triage Vital Signs ED Triage Vitals  Encounter Vitals Group     BP 11/12/22 0900 (!) 151/80     Systolic BP Percentile --      Diastolic BP Percentile --      Pulse Rate 11/12/22 0900 79     Resp 11/12/22 0900 18     Temp 11/12/22 0900 98.4 F (36.9 C)     Temp Source 11/12/22 0900 Oral     SpO2 11/12/22 0900 98 %     Weight --      Height --      Head Circumference --      Peak Flow --      Pain Score 11/12/22 0902 6     Pain Loc --      Pain Education --      Exclude from Growth Chart --    No data found.  Updated Vital Signs BP (!) 151/80 (BP Location: Left Arm)   Pulse 79   Temp 98.4 F (36.9 C) (Oral)   Resp 18   SpO2 99%   Visual Acuity Right Eye Distance:   Left Eye Distance:   Bilateral Distance:    Right Eye Near:   Left  Eye Near:    Bilateral Near:     Physical Exam Vitals reviewed.  Constitutional:      Appearance: She is ill-appearing.  HENT:     Head: Normocephalic and atraumatic.     Right Ear: Tympanic membrane, ear canal and external ear normal. There is no impacted cerumen.     Left  Ear: Tympanic membrane, ear canal and external ear normal. There is no impacted cerumen.     Nose: Congestion and rhinorrhea present.     Mouth/Throat:     Mouth: Mucous membranes are moist.     Pharynx: No oropharyngeal exudate or posterior oropharyngeal erythema.  Eyes:     Extraocular Movements: Extraocular movements intact.     Conjunctiva/sclera: Conjunctivae normal.     Pupils: Pupils are equal, round, and reactive to light.  Cardiovascular:     Rate and Rhythm: Normal rate and regular rhythm.  Pulmonary:     Effort: Pulmonary effort is normal.     Breath sounds: Decreased air movement present. Examination of the right-upper field reveals wheezing. Examination of the left-upper field reveals wheezing. Wheezing present.     Comments: Expiratory wheeze present on exam  Musculoskeletal:     Cervical back: Normal range of motion and neck supple.  Lymphadenopathy:     Cervical: No cervical adenopathy.  Neurological:     General: No focal deficit present.     Mental Status: She is alert.      UC Treatments / Results  Labs (all labs ordered are listed, but only abnormal results are displayed) Labs Reviewed  SARS CORONAVIRUS 2 (TAT 6-24 HRS)    EKG   Radiology No results found.  Procedures Procedures (including critical care time)  Medications Ordered in UC Medications  albuterol (VENTOLIN HFA) 108 (90 Base) MCG/ACT inhaler 2 puff (2 puffs Inhalation Given 11/12/22 0949)  ondansetron (ZOFRAN-ODT) disintegrating tablet 4 mg (4 mg Oral Given 11/12/22 0938)  ipratropium-albuterol (DUONEB) 0.5-2.5 (3) MG/3ML nebulizer solution 3 mL (3 mLs Nebulization Given 11/12/22 4098)    Initial Impression /  Assessment and Plan / UC Course  I have reviewed the triage vital signs and the nursing notes.  Pertinent labs & imaging results that were available during my care of the patient were reviewed by me and considered in my medical decision making (see chart for details).    COVID test pending.  Will cover for an acute upper respiratory infection unfortunately x-rays not available onsite today therefore will cover empirically for a possible pneumonia versus bronchitis with Augmentin twice daily for 10 days.  Also treating with prednisone 40 mg once daily for 5 days.  Promethazine DM for cough.  Patient also received a DuoNeb while here in clinic which improved work of breathing.  Patient is also nausea which has been present since the onset of illness 1 week ago will prescribe Zofran, and 1 dose administered here in clinic.  She is not having any abdominal pain or active vomiting.  Patient given strict return precautions if any of her symptoms worsen or do not improve.  Patient was advised that we will notify her if her results are abnormal.  Patient verbalized understanding and agreement with plan today. Final Clinical Impressions(s) / UC Diagnoses   Final diagnoses:  Encounter for screening for COVID-19  Acute lower respiratory infection  Nausea without vomiting     Discharge Instructions      Take medication as prescribed.  I have also prescribed an albuterol inhaler to use every 4-6 hours 2 puffs as needed for any chest tightness or shortness of breath. If at any point your breathing worsens or you develop any worsening of symptoms return for reevaluation.  If you develop breathlessness or severe chest tightness that does not improve with use of your albuterol inhaler this would be indication to go immediately to the emergency department.  Your COVID test will result within 24 hours and your results will update to MyChart.  Meanwhile you are still symptomatic avoid being in crowds as you can  spread what ever type of a virus to other people when you are symptomatic.  Avoid prolonged exposure to heat as this can worsen your ability to breathe.     ED Prescriptions     Medication Sig Dispense Auth. Provider   amoxicillin-clavulanate (AUGMENTIN) 875-125 MG tablet Take 1 tablet by mouth every 12 (twelve) hours for 10 days. 20 tablet Bing Neighbors, NP   promethazine-dextromethorphan (PROMETHAZINE-DM) 6.25-15 MG/5ML syrup Take 5 mLs by mouth 4 (four) times daily as needed for cough. 240 mL Bing Neighbors, NP   predniSONE (DELTASONE) 20 MG tablet Take 2 tablets (40 mg total) by mouth daily with breakfast. 10 tablet Bing Neighbors, NP   albuterol (VENTOLIN HFA) 108 (90 Base) MCG/ACT inhaler Inhale 1-2 puffs into the lungs every 6 (six) hours as needed for wheezing or shortness of breath. 1 each Bing Neighbors, NP   ondansetron (ZOFRAN) 4 MG tablet Take 1 tablet (4 mg total) by mouth every 6 (six) hours. 12 tablet Bing Neighbors, NP      PDMP not reviewed this encounter.   Bing Neighbors, NP 11/12/22 1028

## 2022-11-12 NOTE — Discharge Instructions (Addendum)
Take medication as prescribed.  I have also prescribed an albuterol inhaler to use every 4-6 hours 2 puffs as needed for any chest tightness or shortness of breath. If at any point your breathing worsens or you develop any worsening of symptoms return for reevaluation.  If you develop breathlessness or severe chest tightness that does not improve with use of your albuterol inhaler this would be indication to go immediately to the emergency department.  Your COVID test will result within 24 hours and your results will update to MyChart.  Meanwhile you are still symptomatic avoid being in crowds as you can spread what ever type of a virus to other people when you are symptomatic.  Avoid prolonged exposure to heat as this can worsen your ability to breathe.

## 2022-11-13 LAB — SARS CORONAVIRUS 2 (TAT 6-24 HRS): SARS Coronavirus 2: POSITIVE — AB

## 2022-11-18 DIAGNOSIS — I10 Essential (primary) hypertension: Secondary | ICD-10-CM | POA: Diagnosis not present

## 2022-11-18 DIAGNOSIS — F418 Other specified anxiety disorders: Secondary | ICD-10-CM | POA: Diagnosis not present

## 2022-11-18 DIAGNOSIS — Z6834 Body mass index (BMI) 34.0-34.9, adult: Secondary | ICD-10-CM | POA: Diagnosis not present

## 2022-11-18 DIAGNOSIS — K5904 Chronic idiopathic constipation: Secondary | ICD-10-CM | POA: Diagnosis not present

## 2022-11-18 DIAGNOSIS — E669 Obesity, unspecified: Secondary | ICD-10-CM | POA: Diagnosis not present

## 2022-11-18 DIAGNOSIS — E7800 Pure hypercholesterolemia, unspecified: Secondary | ICD-10-CM | POA: Diagnosis not present

## 2022-11-18 DIAGNOSIS — U071 COVID-19: Secondary | ICD-10-CM | POA: Diagnosis not present

## 2022-12-30 DIAGNOSIS — F411 Generalized anxiety disorder: Secondary | ICD-10-CM | POA: Diagnosis not present

## 2023-01-13 DIAGNOSIS — F411 Generalized anxiety disorder: Secondary | ICD-10-CM | POA: Diagnosis not present

## 2023-01-19 DIAGNOSIS — F411 Generalized anxiety disorder: Secondary | ICD-10-CM | POA: Diagnosis not present

## 2023-01-27 DIAGNOSIS — F411 Generalized anxiety disorder: Secondary | ICD-10-CM | POA: Diagnosis not present

## 2023-02-03 DIAGNOSIS — F411 Generalized anxiety disorder: Secondary | ICD-10-CM | POA: Diagnosis not present

## 2023-02-10 DIAGNOSIS — F411 Generalized anxiety disorder: Secondary | ICD-10-CM | POA: Diagnosis not present

## 2023-02-24 DIAGNOSIS — F411 Generalized anxiety disorder: Secondary | ICD-10-CM | POA: Diagnosis not present

## 2023-03-09 DIAGNOSIS — F411 Generalized anxiety disorder: Secondary | ICD-10-CM | POA: Diagnosis not present

## 2023-03-16 DIAGNOSIS — F411 Generalized anxiety disorder: Secondary | ICD-10-CM | POA: Diagnosis not present

## 2023-03-23 DIAGNOSIS — F411 Generalized anxiety disorder: Secondary | ICD-10-CM | POA: Diagnosis not present

## 2023-04-07 DIAGNOSIS — F411 Generalized anxiety disorder: Secondary | ICD-10-CM | POA: Diagnosis not present

## 2023-05-04 ENCOUNTER — Ambulatory Visit (INDEPENDENT_AMBULATORY_CARE_PROVIDER_SITE_OTHER): Payer: Medicare PPO

## 2023-05-04 ENCOUNTER — Ambulatory Visit
Admission: EM | Admit: 2023-05-04 | Discharge: 2023-05-04 | Disposition: A | Discharge status: Home / Self Care | Payer: Medicare PPO

## 2023-05-04 DIAGNOSIS — J329 Chronic sinusitis, unspecified: Secondary | ICD-10-CM | POA: Diagnosis not present

## 2023-05-04 DIAGNOSIS — R059 Cough, unspecified: Secondary | ICD-10-CM

## 2023-05-04 DIAGNOSIS — K5730 Diverticulosis of large intestine without perforation or abscess without bleeding: Secondary | ICD-10-CM | POA: Insufficient documentation

## 2023-05-04 DIAGNOSIS — K219 Gastro-esophageal reflux disease without esophagitis: Secondary | ICD-10-CM | POA: Insufficient documentation

## 2023-05-04 DIAGNOSIS — I10 Essential (primary) hypertension: Secondary | ICD-10-CM | POA: Insufficient documentation

## 2023-05-04 DIAGNOSIS — Z860100 Personal history of colon polyps, unspecified: Secondary | ICD-10-CM | POA: Insufficient documentation

## 2023-05-04 DIAGNOSIS — K635 Polyp of colon: Secondary | ICD-10-CM | POA: Insufficient documentation

## 2023-05-04 DIAGNOSIS — K5904 Chronic idiopathic constipation: Secondary | ICD-10-CM | POA: Insufficient documentation

## 2023-05-04 DIAGNOSIS — F418 Other specified anxiety disorders: Secondary | ICD-10-CM | POA: Insufficient documentation

## 2023-05-04 DIAGNOSIS — R141 Gas pain: Secondary | ICD-10-CM | POA: Insufficient documentation

## 2023-05-04 DIAGNOSIS — J40 Bronchitis, not specified as acute or chronic: Secondary | ICD-10-CM

## 2023-05-04 DIAGNOSIS — F419 Anxiety disorder, unspecified: Secondary | ICD-10-CM | POA: Insufficient documentation

## 2023-05-04 DIAGNOSIS — R194 Change in bowel habit: Secondary | ICD-10-CM | POA: Insufficient documentation

## 2023-05-04 DIAGNOSIS — Z1211 Encounter for screening for malignant neoplasm of colon: Secondary | ICD-10-CM | POA: Insufficient documentation

## 2023-05-04 DIAGNOSIS — R1032 Left lower quadrant pain: Secondary | ICD-10-CM | POA: Insufficient documentation

## 2023-05-04 LAB — POCT URINALYSIS DIP (MANUAL ENTRY)
Bilirubin, UA: NEGATIVE
Glucose, UA: NEGATIVE mg/dL
Ketones, POC UA: NEGATIVE mg/dL
Leukocytes, UA: NEGATIVE
Nitrite, UA: NEGATIVE
Protein Ur, POC: NEGATIVE mg/dL
Spec Grav, UA: 1.01 (ref 1.010–1.025)
Urobilinogen, UA: 0.2 U/dL
pH, UA: 7 (ref 5.0–8.0)

## 2023-05-04 MED ORDER — AMOXICILLIN-POT CLAVULANATE 875-125 MG PO TABS: 1.0000 | ORAL_TABLET | Freq: Two times a day (BID) | ORAL | 0 refills | Status: AC

## 2023-05-04 MED ORDER — PREDNISONE 20 MG PO TABS: 40.0000 mg | ORAL_TABLET | Freq: Every day | ORAL | 0 refills | Status: AC

## 2023-05-04 NOTE — ED Provider Notes (Signed)
 [facility]    CSN: [id number] Arrival date & time: 05/04/23  9182      History   Chief Complaint Chief Complaint  Patient presents with   Cough   Back Pain    Left/Right Lower   Headache   Hoarse    HPI Danielle Anthony is a 67 y.o. female.   Here today for evaluation of several weeks of cough, hoarse voice.  She reports that she has had some headache at times.  She reports that her cough has been more persistent recently but is nonproductive.  She also reports some right and left lower back pain.  She denies any urinary symptoms.  The history is provided by the patient.  Cough Associated symptoms: headaches and sore throat   Associated symptoms: no chills, no ear pain, no eye discharge, no fever, no shortness of breath and no wheezing   Back Pain Associated symptoms: headaches   Associated symptoms: no abdominal pain and no fever   Headache Associated symptoms: back pain, congestion, cough, sinus pressure and sore throat   Associated symptoms: no abdominal pain, no diarrhea, no ear pain, no fever, no nausea and no vomiting     Past Medical History:  Diagnosis Date   Anxiety    Chronic constipation    Complication of anesthesia    Depression    GERD (gastroesophageal reflux disease)    Hypertension     Patient Active Problem List   Diagnosis Date Noted   Anxiety 05/04/2023   Change in bowel habit 05/04/2023   Chronic idiopathic constipation 05/04/2023   Colon cancer screening 05/04/2023   Colon polyps 05/04/2023   Diverticular disease of colon 05/04/2023   Flatulence, eructation and gas pain 05/04/2023   Gastro-esophageal reflux disease without esophagitis 05/04/2023   Gastroesophageal reflux disease 05/04/2023   History of colonic polyps 05/04/2023   Left lower quadrant pain 05/04/2023   Mixed anxiety and depressive disorder 05/04/2023   Hypertension 05/04/2023   Primary localized osteoarthritis of knee 07/12/2018   Primary  osteoarthritis of right knee 06/21/2018   Unilateral primary osteoarthritis, right knee 12/11/2016   Obesity 04/22/2014   HTN (hypertension) 08/15/2011    Past Surgical History:  Procedure Laterality Date   ABDOMINAL HYSTERECTOMY     BREAST SURGERY     COLON SURGERY     COLONOSCOPY     EYE SURGERY Bilateral 2018   Cataract   FOOT SURGERY     KNEE ARTHROPLASTY     TOTAL KNEE ARTHROPLASTY Right 07/12/2018   Procedure: TOTAL KNEE ARTHROPLASTY;  Surgeon: Beverley Evalene BIRCH, MD;  Location: [facility];  Service: Orthopedics;  Laterality: Right;    OB History   No obstetric history on file.      Home Medications    Prior to Admission medications   Medication Sig Start Date End Date Taking? Authorizing Provider  amLODipine  (NORVASC ) 10 MG tablet TAKE 1 TABLET (10 MG TOTAL) BY MOUTH DAILY. Patient taking differently: Take 10 mg by mouth daily. 12/27/11  Yes Jeffery, Bernita, PA  amoxicillin -clavulanate (AUGMENTIN ) 875-125 MG tablet Take 1 tablet by mouth every 12 (twelve) hours. 05/04/23  Yes Billy Asberry FALCON, PA-C  citalopram  (CELEXA ) 20 MG tablet Take 20 mg by mouth daily.   Yes [provider]  hydrochlorothiazide  (HYDRODIURIL ) 25 MG tablet TAKE 1 TABLET (25 MG TOTAL) BY MOUTH DAILY. Patient taking differently: Take 25 mg by mouth daily. 11/28/11  Yes McClung, Jon HERO, PA-C  linaclotide  (LINZESS ) 290 MCG CAPS capsule  Take 290 mcg by mouth daily before breakfast. 08/17/16  Yes [provider]  Phenylephrine -Pheniramine-DM (THERAFLU COLD & COUGH PO) Take by mouth.   Yes [provider]  predniSONE  (DELTASONE ) 20 MG tablet Take 2 tablets (40 mg total) by mouth daily with breakfast for 5 days. 05/04/23 05/09/23 Yes Billy Asberry FALCON, PA-C  albuterol  (VENTOLIN  HFA) 108 (90 Base) MCG/ACT inhaler Inhale 1-2 puffs into the lungs every 6 (six) hours as needed for wheezing or shortness of breath. 11/12/22   Arloa Suzen RAMAN, NP  albuterol  (VENTOLIN  HFA) 108 (90 Base) MCG/ACT  inhaler Inhale 2 puffs into the lungs every 4 (four) hours as needed for wheezing or shortness of breath.    [provider]  amLODipine  (NORVASC ) 10 MG tablet Take 1 tablet by mouth daily.    [provider]  aspirin  EC 81 MG tablet Take 1 tablet (81 mg total) by mouth 2 (two) times daily. For DVT prophylaxis for 30 days after surgery. 07/12/18   Delynn Victory Muller III, PA-C  atorvastatin (LIPITOR) 40 MG tablet Take 40 mg by mouth at bedtime. 11/02/21   [provider]  atorvastatin (LIPITOR) 40 MG tablet Take 40 mg by mouth daily.    [provider]  Carboxymethylcellulose Sodium (THERATEARS) 0.25 % SOLN Place 1-2 drops into both eyes every 2 (two) hours as needed (for dry/irritated eyes.).    [provider]  cephALEXin  (KEFLEX ) 500 MG capsule Take 1 capsule (500 mg total) by mouth 3 (three) times daily. 01/26/22   Gershon Donnice SAUNDERS, DPM  citalopram  (CELEXA ) 20 MG tablet Take 20 mg by mouth daily.    [provider]  clonazePAM  (KLONOPIN ) 0.5 MG tablet Take 0.5 mg by mouth 2 (two) times daily as needed for anxiety.    [provider]  diphenhydrAMINE  (BENADRYL  ALLERGY) 25 MG tablet Take 25 mg by mouth at bedtime as needed.    [provider]  diphenhydrAMINE  (EQ ALLERGY RELIEF) 25 MG tablet Take 25 mg by mouth at bedtime.    [provider]  linaclotide  (LINZESS ) 290 MCG CAPS capsule Take 290 mcg by mouth daily before breakfast.    [provider]  omeprazole  (PRILOSEC) 20 MG capsule Take 1 capsule (20 mg total) by mouth daily. 30 days for gastroprotection while taking NSAIDs. 07/12/18   Delynn Victory Muller III, PA-C  ondansetron  (ZOFRAN ) 4 MG tablet Take 1 tablet (4 mg total) by mouth every 6 (six) hours. 11/12/22   Arloa Suzen RAMAN, NP  Probiotic Product (PROBIOTIC PO) Take 2 capsules by mouth daily.    [provider]  promethazine -dextromethorphan (PROMETHAZINE -DM) 6.25-15 MG/5ML syrup Take  5 mLs by mouth 4 (four) times daily as needed for cough. 11/12/22   Arloa Suzen RAMAN, NP  TRULANCE 3 MG TABS Take 1 tablet by mouth daily. 01/14/22   [provider]    Family History Family History  Problem Relation Age of Onset   Heart disease Brother     Social History Social History   Tobacco Use   Smoking status: Never    Passive exposure: Never   Smokeless tobacco: Never  Vaping Use   Vaping status: Never Used  Substance Use Topics   Alcohol use: Yes    Alcohol/week: 2.0 standard drinks of alcohol    Types: 2 Cans of beer per week    Comment: Occassionally.   Drug use: Never     Allergies   Blood-group specific substance and Lisinopril   Review of Systems  Review of Systems  Constitutional:  Negative for chills and fever.  HENT:  Positive for congestion, sinus pressure, sore throat and voice change. Negative for ear pain.   Eyes:  Negative for discharge and redness.  Respiratory:  Positive for cough. Negative for shortness of breath and wheezing.   Gastrointestinal:  Negative for abdominal pain, diarrhea, nausea and vomiting.  Musculoskeletal:  Positive for back pain.  Neurological:  Positive for headaches.     Physical Exam Triage Vital Signs ED Triage Vitals  Encounter Vitals Group     BP 05/04/23 1008 (!) 166/84     Systolic BP Percentile --      Diastolic BP Percentile --      Pulse Rate 05/04/23 1008 67     Resp 05/04/23 1008 20     Temp 05/04/23 1008 98.6 F (37 C)     Temp Source 05/04/23 1008 Oral     SpO2 05/04/23 1008 97 %     Weight 05/04/23 1006 201 lb 4.5 oz (91.3 kg)     Height 05/04/23 1006 5' 6 (1.676 m)     Head Circumference --      Peak Flow --      Pain Score 05/04/23 1003 3     Pain Loc --      Pain Education --      Exclude from Growth Chart --    No data found.  Updated Vital Signs BP (!) 166/84 (BP Location: Left Arm) Comment: No BP med today, last dose yesterday. Will recheck during or after visit.  Pulse 67    Temp 98.6 F (37 C) (Oral)   Resp 20   Ht 5' 6 (1.676 m)   Wt 201 lb 4.5 oz (91.3 kg)   SpO2 97%   BMI 32.49 kg/m   Visual Acuity Right Eye Distance:   Left Eye Distance:   Bilateral Distance:    Right Eye Near:   Left Eye Near:    Bilateral Near:     Physical Exam Vitals and nursing note reviewed.  Constitutional:      General: She is not in acute distress.    Appearance: Normal appearance. She is not ill-appearing.  HENT:     Head: Normocephalic and atraumatic.     Right Ear: Tympanic membrane normal.     Left Ear: Tympanic membrane normal.     Nose: Congestion present.     Mouth/Throat:     Mouth: Mucous membranes are moist.     Pharynx: No oropharyngeal exudate or posterior oropharyngeal erythema.  Eyes:     Conjunctiva/sclera: Conjunctivae normal.  Cardiovascular:     Rate and Rhythm: Normal rate and regular rhythm.     Heart sounds: Normal heart sounds. No murmur heard. Pulmonary:     Effort: Pulmonary effort is normal. No respiratory distress.     Breath sounds: Wheezing present. No rhonchi or rales.  Skin:    General: Skin is warm and dry.  Neurological:     Mental Status: She is alert.  Psychiatric:        Mood and Affect: Mood normal.        Thought Content: Thought content normal.      UC Treatments / Results  Labs (all labs ordered are listed, but only abnormal results are displayed) Labs Reviewed  POCT URINALYSIS DIP (MANUAL ENTRY) - Abnormal; Notable for the following components:      Result Value   Color, UA light yellow (*)  Blood, UA trace-intact (*)    All other components within normal limits    EKG   Radiology No results found.  Procedures Procedures (including critical care time)  Medications Ordered in UC Medications - No data to display  Initial Impression / Assessment and Plan / UC Course  I have reviewed the triage vital signs and the nursing notes.  Pertinent labs & imaging results that were available during  my care of the patient were reviewed by me and considered in my medical decision making (see chart for details).    Chest x-ray without acute finding on initial read.  Steroid burst and Augmentin  prescribed to cover sinobronchitis.  Recommended further evaluation if no gradual improvement with any further concerns.   Final Clinical Impressions(s) / UC Diagnoses   Final diagnoses:  Cough, unspecified type  Sinobronchitis   Discharge Instructions   None    ED Prescriptions     Medication Sig Dispense Auth. Provider   predniSONE  (DELTASONE ) 20 MG tablet Take 2 tablets (40 mg total) by mouth daily with breakfast for 5 days. 10 tablet Billy Stabs F, PA-C   amoxicillin -clavulanate (AUGMENTIN ) 875-125 MG tablet Take 1 tablet by mouth every 12 (twelve) hours. 14 tablet Billy Stabs FALCON, PA-C      PDMP not reviewed this encounter.   Billy Stabs FALCON, PA-C 05/04/23 1159

## 2023-05-04 NOTE — ED Triage Notes (Signed)
 My husband had been sick before Christmas, for me it started Friday last week with Cough, Hoarse voice (not really sore throat), fatigue, tired a lot, ha then and now, multiple symptoms. The Cough is most concerning and persistent, non productive. No fever known. I am having left/right lower back at times as well. No urinary problems noticed or known.

## 2023-05-20 DIAGNOSIS — K5904 Chronic idiopathic constipation: Secondary | ICD-10-CM | POA: Diagnosis not present

## 2023-05-20 DIAGNOSIS — Z0000 Encounter for general adult medical examination without abnormal findings: Secondary | ICD-10-CM | POA: Diagnosis not present

## 2023-05-20 DIAGNOSIS — I10 Essential (primary) hypertension: Secondary | ICD-10-CM | POA: Diagnosis not present

## 2023-05-20 DIAGNOSIS — M1712 Unilateral primary osteoarthritis, left knee: Secondary | ICD-10-CM | POA: Diagnosis not present

## 2023-05-20 DIAGNOSIS — Z6835 Body mass index (BMI) 35.0-35.9, adult: Secondary | ICD-10-CM | POA: Diagnosis not present

## 2023-05-20 DIAGNOSIS — K5730 Diverticulosis of large intestine without perforation or abscess without bleeding: Secondary | ICD-10-CM | POA: Diagnosis not present

## 2023-05-20 DIAGNOSIS — E7800 Pure hypercholesterolemia, unspecified: Secondary | ICD-10-CM | POA: Diagnosis not present

## 2023-05-20 DIAGNOSIS — F418 Other specified anxiety disorders: Secondary | ICD-10-CM | POA: Diagnosis not present

## 2023-10-15 ENCOUNTER — Other Ambulatory Visit: Payer: Self-pay | Admitting: Internal Medicine

## 2023-10-15 DIAGNOSIS — Z1231 Encounter for screening mammogram for malignant neoplasm of breast: Secondary | ICD-10-CM

## 2023-10-19 ENCOUNTER — Ambulatory Visit
Admission: RE | Admit: 2023-10-19 | Discharge: 2023-10-19 | Discharge status: Home / Self Care | Source: Ambulatory Visit | Attending: Internal Medicine

## 2023-10-19 DIAGNOSIS — Z1231 Encounter for screening mammogram for malignant neoplasm of breast: Secondary | ICD-10-CM

## 2023-10-21 ENCOUNTER — Other Ambulatory Visit: Payer: Self-pay | Admitting: Internal Medicine

## 2023-10-21 DIAGNOSIS — R928 Other abnormal and inconclusive findings on diagnostic imaging of breast: Secondary | ICD-10-CM

## 2023-10-28 ENCOUNTER — Ambulatory Visit
Admission: RE | Admit: 2023-10-28 | Discharge: 2023-10-28 | Disposition: A | Discharge status: Home / Self Care | Source: Ambulatory Visit | Attending: Internal Medicine | Admitting: Internal Medicine

## 2023-10-28 ENCOUNTER — Other Ambulatory Visit: Payer: Self-pay | Admitting: Internal Medicine

## 2023-10-28 DIAGNOSIS — R928 Other abnormal and inconclusive findings on diagnostic imaging of breast: Secondary | ICD-10-CM

## 2023-10-28 DIAGNOSIS — N6320 Unspecified lump in the left breast, unspecified quadrant: Secondary | ICD-10-CM

## 2023-11-01 ENCOUNTER — Ambulatory Visit
Admission: RE | Admit: 2023-11-01 | Discharge: 2023-11-01 | Disposition: A | Discharge status: Home / Self Care | Source: Ambulatory Visit | Attending: Internal Medicine

## 2023-11-01 DIAGNOSIS — N6320 Unspecified lump in the left breast, unspecified quadrant: Secondary | ICD-10-CM

## 2023-11-01 DIAGNOSIS — R928 Other abnormal and inconclusive findings on diagnostic imaging of breast: Secondary | ICD-10-CM

## 2023-12-27 DIAGNOSIS — F418 Other specified anxiety disorders: Secondary | ICD-10-CM | POA: Diagnosis not present

## 2023-12-27 DIAGNOSIS — R7301 Impaired fasting glucose: Secondary | ICD-10-CM | POA: Diagnosis not present

## 2023-12-27 DIAGNOSIS — E7800 Pure hypercholesterolemia, unspecified: Secondary | ICD-10-CM | POA: Diagnosis not present

## 2023-12-27 DIAGNOSIS — K5904 Chronic idiopathic constipation: Secondary | ICD-10-CM | POA: Diagnosis not present

## 2023-12-27 DIAGNOSIS — R5383 Other fatigue: Secondary | ICD-10-CM | POA: Diagnosis not present

## 2023-12-27 DIAGNOSIS — I10 Essential (primary) hypertension: Secondary | ICD-10-CM | POA: Diagnosis not present

## 2023-12-27 DIAGNOSIS — M1712 Unilateral primary osteoarthritis, left knee: Secondary | ICD-10-CM | POA: Diagnosis not present

## 2023-12-27 DIAGNOSIS — R053 Chronic cough: Secondary | ICD-10-CM | POA: Diagnosis not present

## 2024-01-26 ENCOUNTER — Ambulatory Visit (INDEPENDENT_AMBULATORY_CARE_PROVIDER_SITE_OTHER)

## 2024-01-26 VITALS — BP 150/85 | HR 76 | Temp 97.8°F | Ht 66.0 in | Wt 208.6 lb

## 2024-01-26 DIAGNOSIS — R053 Chronic cough: Secondary | ICD-10-CM

## 2024-01-26 MED ORDER — FLUTICASONE PROPIONATE 50 MCG/ACT NA SUSP
2.0000 | Freq: Every day | NASAL | 2 refills | Status: AC
Start: 2024-01-26 — End: ?

## 2024-01-26 MED ORDER — CETIRIZINE HCL 10 MG PO TABS: 10.0000 mg | ORAL_TABLET | Freq: Every day | ORAL | 3 refills | Status: DC

## 2024-01-26 MED ORDER — PANTOPRAZOLE SODIUM 40 MG PO TBEC: 40.0000 mg | DELAYED_RELEASE_TABLET | Freq: Every day | ORAL | 3 refills | Status: DC

## 2024-01-26 NOTE — Patient Instructions (Addendum)
 It was nice meeting you today in the clinic.    I suspect that your cough is related to acid reflux as the main problem, in addition you have sinus congestion and allergies as well as postnasal drip.  Those are very common to cause chronic cough and need to be addressed first.  For your postnasal drip please use Flonase  2 puffs in each nostril every night.  For your allergies start taking cetirizine  every day.   For the acid reflux.  I have started you on Protonix  please take that every day.  In addition you will need to adhere to a GERD diet as below   Foods to Avoid:  Acidic foods: Citrus fruits, tomatoes, tomato-based products, vinegar, garlic, onions  Fatty foods: Fried foods, fatty meats, whole milk, cheese  Spicy foods: Chili peppers, black pepper, mustard  Chocolate: Contains caffeine and theobromine, which relax the lower esophageal sphincter (LES)  Alcohol: Relaxes the LES and increases stomach acid production  Caffeine: Found in coffee, tea, and some sodas, it can stimulate stomach acid production    Foods to Eat:  Lean proteins: Chicken, fish, eggs Non-acidic fruits: Apples, bananas, pears, grapes Vegetables: Steamed, roasted, or boiled vegetables (e.g., broccoli, carrots, green beans) Whole grains: Brown rice, quinoa, oatmeal Low-fat dairy: Skim milk, low-fat yogurt, low-fat cheese Water : Staying hydrated helps dilute stomach acid    Other Dietary Recommendations: Eat smaller, more frequent meals. Avoid eating within 2-3 hours of bedtime. Chew food thoroughly. Limit sugary drinks and processed foods. Consider a Mediterranean-style diet, which is rich in fruits, vegetables, and whole grains.    I will see you back in 1 month.

## 2024-01-26 NOTE — Progress Notes (Signed)
 Subjective:   PATIENT ID: Danielle Anthony GENDER: female DOB: January 04, 1956, MRN: [id number]   HPI 68 year old female with a past medical history of hypertension, hyper cholesterolemia, chronic constipation on Linzess  who is presenting to the pulmonary clinic for further evaluation of chronic cough for years.  Patient is also noted to have a past medical history of GERD and previously was on omeprazole .   She notes that she has had a cough for a very long time and she lives with that now but some people are bothered around her by the amount of coughing that she has.  She states that she has tried a lot of things in the past with her primary care to fix the cough with minimal success.  She states that she tried albuterol  which only gives her relief 4 minutes before her symptoms come back.  She also tried taking over-the-counter Tessalon Perles and cough medicine without success.  She is no longer taking omeprazole  but she remembers taking it at some point.  She does have acid reflux symptoms as well.  She has sinus congestion and postnasal drip as well.  She reports episodes of wheezing and cough with productive yellow sputum sometimes.  She reports multiple allergies to linen and usually her cough is worse with pollen season.  Sometimes she takes Benadryl  and noted some benefit but it makes her very sleepy.  She denies any fevers or chills or night sweats.  She denies any unintentional weight loss or loss of appetite.  She knows that some food types caused her more acid reflux and tries to avoid them.  She is retired and used to work in a daycare.  She is a never smoker.  Past Medical History:  Diagnosis Date   Anxiety    Chronic constipation    Complication of anesthesia    Depression    GERD (gastroesophageal reflux disease)    Hypertension      Family History  Problem Relation Age of Onset   Heart disease Brother      Social History   Socioeconomic History   Marital status:  Married    Spouse name: Not on file   Number of children: Not on file   Years of education: Not on file   Highest education level: Not on file  Occupational History   Not on file  Tobacco Use   Smoking status: Never    Passive exposure: Never   Smokeless tobacco: Never  Vaping Use   Vaping status: Never Used  Substance and Sexual Activity   Alcohol use: Yes    Alcohol/week: 2.0 standard drinks of alcohol    Types: 2 Cans of beer per week    Comment: Occassionally.   Drug use: Never   Sexual activity: Not Currently  Other Topics Concern   Not on file  Social History Narrative   Not on file   Social Drivers of Health   Financial Resource Strain: Not on file  Food Insecurity: Not on file  Transportation Needs: Not on file  Physical Activity: Not on file  Stress: Not on file  Social Connections: Not on file  Intimate Partner Violence: Not on file     Allergies  Allergen Reactions   Blood-Group Specific Substance     No blood products   Lisinopril Swelling    Other Reaction(s): lips/throat swelling     Outpatient Medications Prior to Visit  Medication Sig Dispense Refill   albuterol  (VENTOLIN  HFA) 108 (90 Base) MCG/ACT inhaler  Inhale 1-2 puffs into the lungs every 6 (six) hours as needed for wheezing or shortness of breath. 1 each 0   albuterol  (VENTOLIN  HFA) 108 (90 Base) MCG/ACT inhaler Inhale 2 puffs into the lungs every 4 (four) hours as needed for wheezing or shortness of breath.     amLODipine  (NORVASC ) 10 MG tablet TAKE 1 TABLET (10 MG TOTAL) BY MOUTH DAILY. (Patient taking differently: Take 10 mg by mouth daily.) 30 tablet 2   amLODipine  (NORVASC ) 10 MG tablet Take 1 tablet by mouth daily.     amoxicillin -clavulanate (AUGMENTIN ) 875-125 MG tablet Take 1 tablet by mouth every 12 (twelve) hours. 14 tablet 0   aspirin  EC 81 MG tablet Take 1 tablet (81 mg total) by mouth 2 (two) times daily. For DVT prophylaxis for 30 days after surgery. 60 tablet 0   atorvastatin  (LIPITOR) 40 MG tablet Take 40 mg by mouth at bedtime.     atorvastatin (LIPITOR) 40 MG tablet Take 40 mg by mouth daily.     Carboxymethylcellulose Sodium (THERATEARS) 0.25 % SOLN Place 1-2 drops into both eyes every 2 (two) hours as needed (for dry/irritated eyes.).     cephALEXin  (KEFLEX ) 500 MG capsule Take 1 capsule (500 mg total) by mouth 3 (three) times daily. 21 capsule 0   citalopram  (CELEXA ) 20 MG tablet Take 20 mg by mouth daily.     citalopram  (CELEXA ) 20 MG tablet Take 20 mg by mouth daily.     clonazePAM  (KLONOPIN ) 0.5 MG tablet Take 0.5 mg by mouth 2 (two) times daily as needed for anxiety.     diphenhydrAMINE  (BENADRYL  ALLERGY) 25 MG tablet Take 25 mg by mouth at bedtime as needed.     diphenhydrAMINE  (EQ ALLERGY RELIEF) 25 MG tablet Take 25 mg by mouth at bedtime.     hydrochlorothiazide  (HYDRODIURIL ) 25 MG tablet TAKE 1 TABLET (25 MG TOTAL) BY MOUTH DAILY. (Patient taking differently: Take 25 mg by mouth daily.) 30 tablet 1   linaclotide  (LINZESS ) 290 MCG CAPS capsule Take 290 mcg by mouth daily before breakfast.     linaclotide  (LINZESS ) 290 MCG CAPS capsule Take 290 mcg by mouth daily before breakfast.     ondansetron  (ZOFRAN ) 4 MG tablet Take 1 tablet (4 mg total) by mouth every 6 (six) hours. 12 tablet 0   Probiotic Product (PROBIOTIC PO) Take 2 capsules by mouth daily.     promethazine -dextromethorphan (PROMETHAZINE -DM) 6.25-15 MG/5ML syrup Take 5 mLs by mouth 4 (four) times daily as needed for cough. 240 mL 0   TRULANCE 3 MG TABS Take 1 tablet by mouth daily.     omeprazole  (PRILOSEC) 20 MG capsule Take 1 capsule (20 mg total) by mouth daily. 30 days for gastroprotection while taking NSAIDs. 30 capsule 0   Phenylephrine -Pheniramine-DM (THERAFLU COLD & COUGH PO) Take by mouth.     No facility-administered medications prior to visit.    ROS Reviewed all systems and reported negative except as above     Objective:   Vitals:   01/26/24 0824  BP: (!) 150/85  Pulse:  76  Temp: 97.8 F (36.6 C)  TempSrc: Temporal  SpO2: 98%  Weight: 208 lb 9.6 oz (94.6 kg)  Height: 5' 6 (1.676 m)    Physical Exam General: Elderly female, very pleasant not in acute distress Chest: Clear to auscultation bilaterally, few crackles Heart: Soft, nontender, nondistended Extremities: Warm, well-perfused    CBC    Component Value Date/Time   WBC 6.1 07/05/2018 1531  RBC 4.91 07/05/2018 1531   HGB 13.5 07/05/2018 1531   HCT 42.5 07/05/2018 1531   PLT 390 07/05/2018 1531   MCV 86.6 07/05/2018 1531   MCV 81.3 12/25/2014 1236   MCH 27.5 07/05/2018 1531   MCHC 31.8 07/05/2018 1531   RDW 13.3 07/05/2018 1531     Chest imaging: She had a CT sinus in 2011 and was noted to be negative.  She has no CT imaging of the chest. She has a chest x-ray from the end of December 2024 which shows no acute cardiopulmonary process with clear lungs without any infiltrates.  Normal diaphragms  PFT: No PFTs on file.   Labs: She has no recent blood work that I can see in her chart but her blood work from 2020 showed a normal CBC without anemia there was no differential performed.      Assessment & Plan:   Assessment & Plan Chronic cough Her cough is likely multifactorial in the setting of multiple allergies and acid reflux.  Difficult to rule out asthma as well at this time however given evident acid reflux disease and sinus allergies would start with targeting those first and have close follow-up in 1 month.  In regards to her GERD I have started her on Protonix , in her chart it mentions omeprazole  although she states that she was not taking it.  I advised her to adhere to a full GERD diet at least for 1 month and gave her clear instructions on how to do that.  In regards to her postnasal drip and sinus allergies I have started her on cetirizine  and a Flonase  nasal spray.  Will also evaluate her allergies with a full [place]  region allergy panel and a complete look  count with differential.  We might need to get PFTs in the future if there is any noted abnormality on the chest x-ray we are getting or if she has no resolution of symptoms despite these measures.  There is still a chance that cough can be of unclear etiology and if that is the case then we can consider empiric treatment in the future Orders:   cetirizine  (ZYRTEC  ALLERGY) 10 MG tablet; Take 1 tablet (10 mg total) by mouth daily.   CBC w/Diff; Future   RESPIRATORY ALLERGY PANEL REGION II W/ RFLX: [place]; Future   fluticasone  (FLONASE ) 50 MCG/ACT nasal spray; Place 2 sprays into both nostrils daily.   pantoprazole  (PROTONIX ) 40 MG tablet; Take 1 tablet (40 mg total) by mouth daily.    Zola Herter, MD [facility] Office: [phone]

## 2024-02-24 DIAGNOSIS — R053 Chronic cough: Secondary | ICD-10-CM | POA: Diagnosis not present

## 2024-02-24 DIAGNOSIS — I10 Essential (primary) hypertension: Secondary | ICD-10-CM | POA: Diagnosis not present

## 2024-02-28 DIAGNOSIS — Z111 Encounter for screening for respiratory tuberculosis: Secondary | ICD-10-CM | POA: Diagnosis not present

## 2024-04-21 ENCOUNTER — Other Ambulatory Visit: Payer: Self-pay

## 2024-04-21 DIAGNOSIS — R053 Chronic cough: Secondary | ICD-10-CM

## 2024-04-22 ENCOUNTER — Other Ambulatory Visit: Payer: Self-pay

## 2024-04-22 DIAGNOSIS — R053 Chronic cough: Secondary | ICD-10-CM

## 2024-06-12 IMAGING — MG MM DIGITAL SCREENING BILAT W/ TOMO AND CAD
8 series · 8 of 24 positions shown · non-contrast
Comparison: Previous exam(s).

CLINICAL DATA: Screening.

EXAM:
DIGITAL SCREENING BILATERAL MAMMOGRAM WITH TOMOSYNTHESIS AND CAD
TECHNIQUE: Bilateral screening digital craniocaudal and mediolateral oblique
mammograms were obtained. Bilateral screening digital breast
tomosynthesis was performed. The images were evaluated with
computer-aided detection.

[L MLO synth-2D]
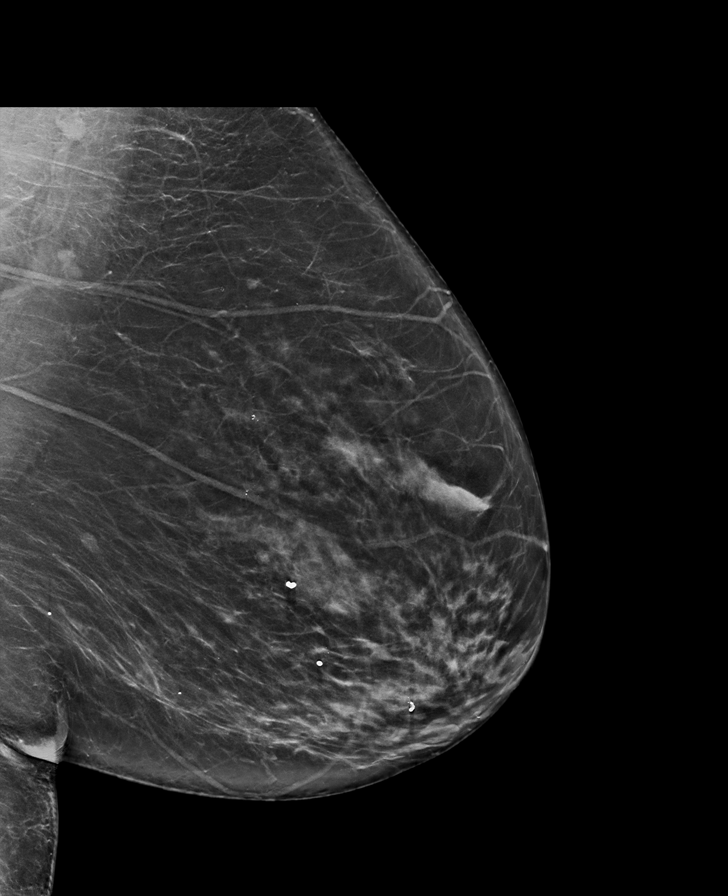

[R MLO synth-2D]
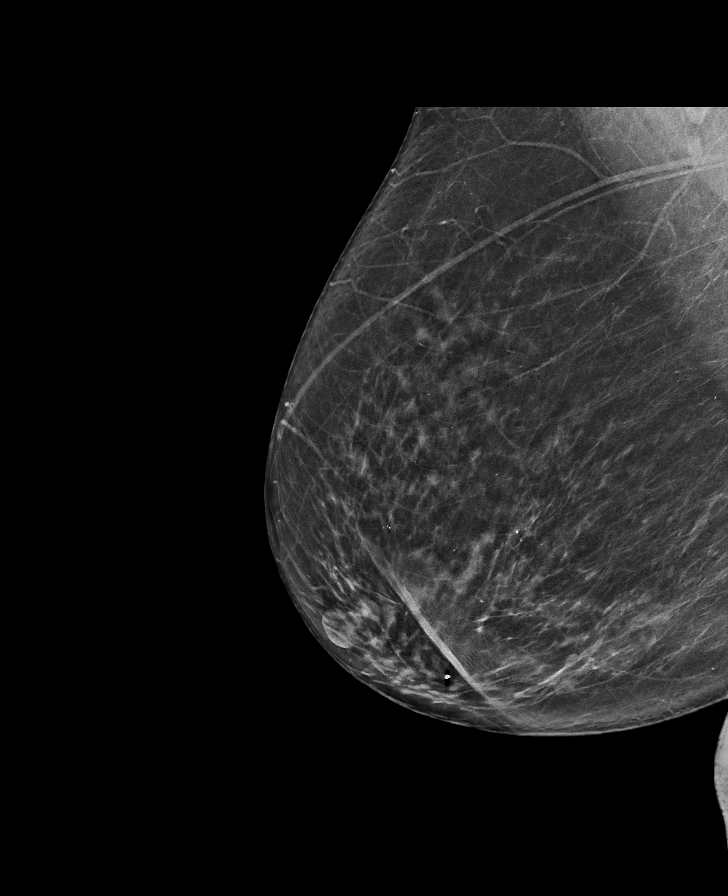

[L CC synth-2D]
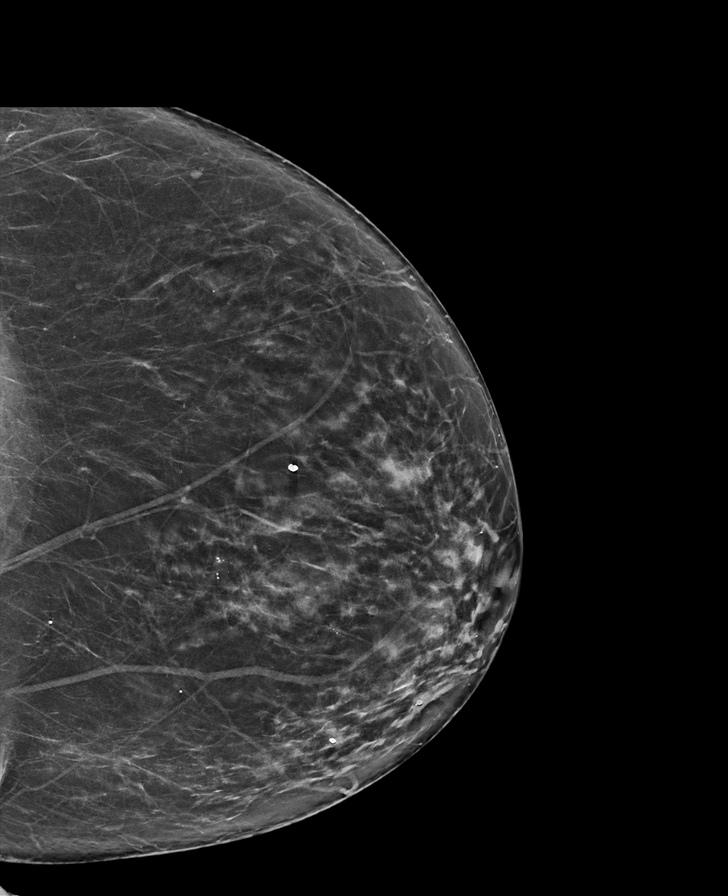

[R CC synth-2D]
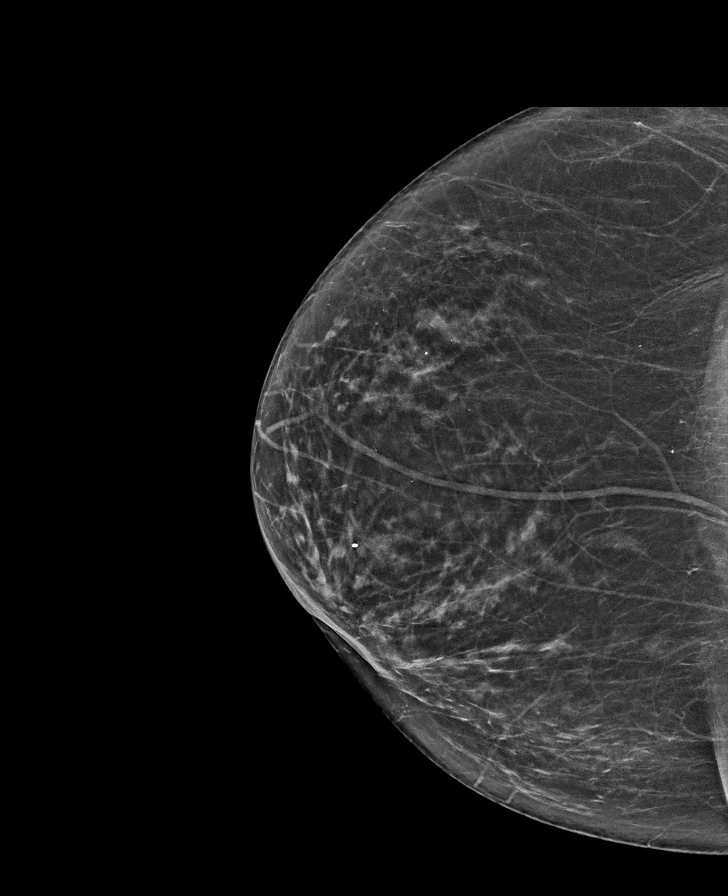

[L MLO tomo · tomo slice 39/77.0]
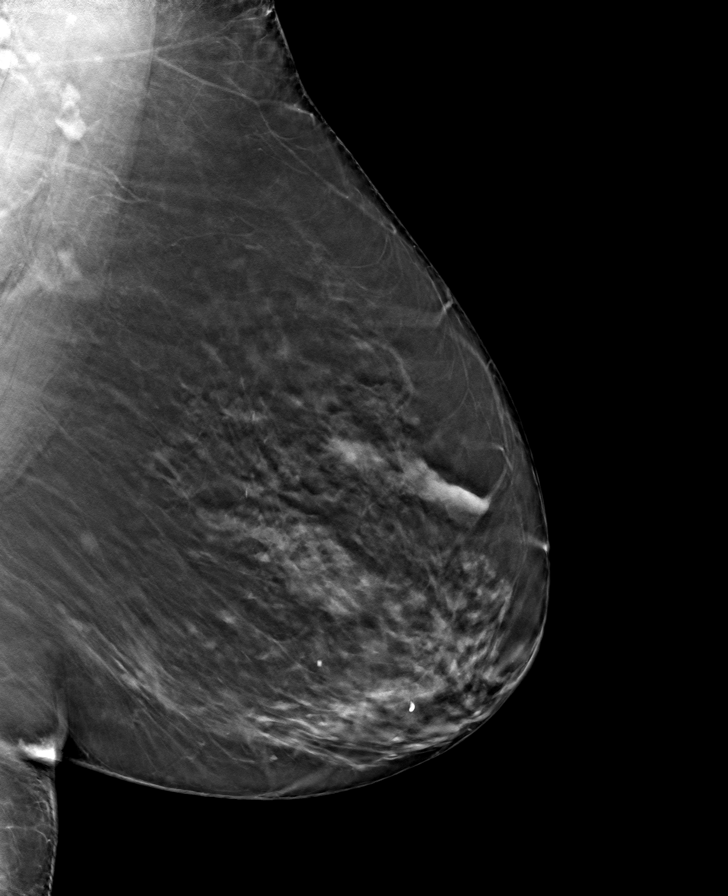

[R MLO tomo · tomo slice 37/72.0]
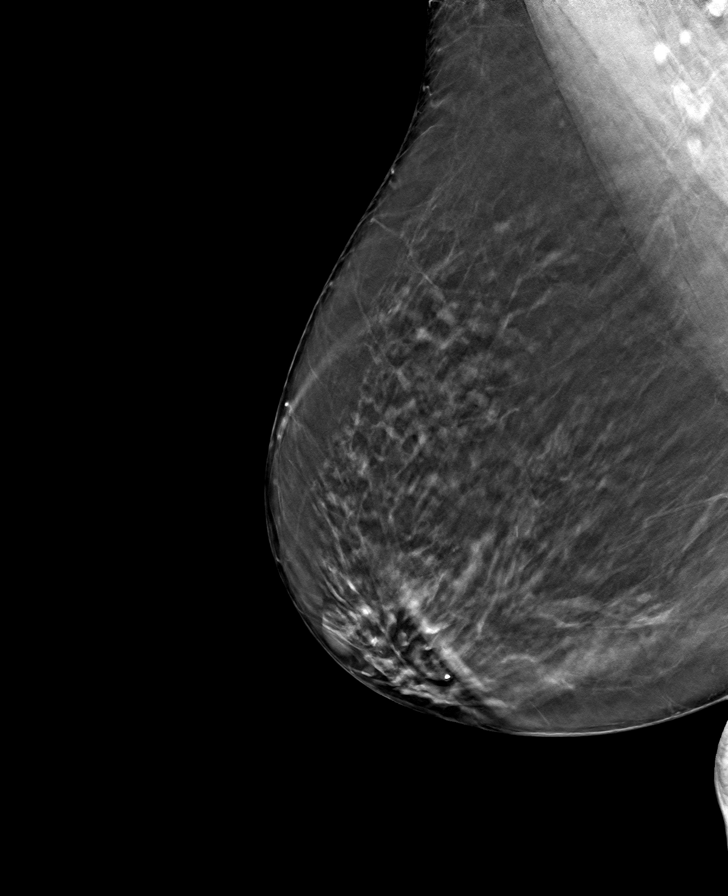

[L CC tomo · tomo slice 35/70.0]
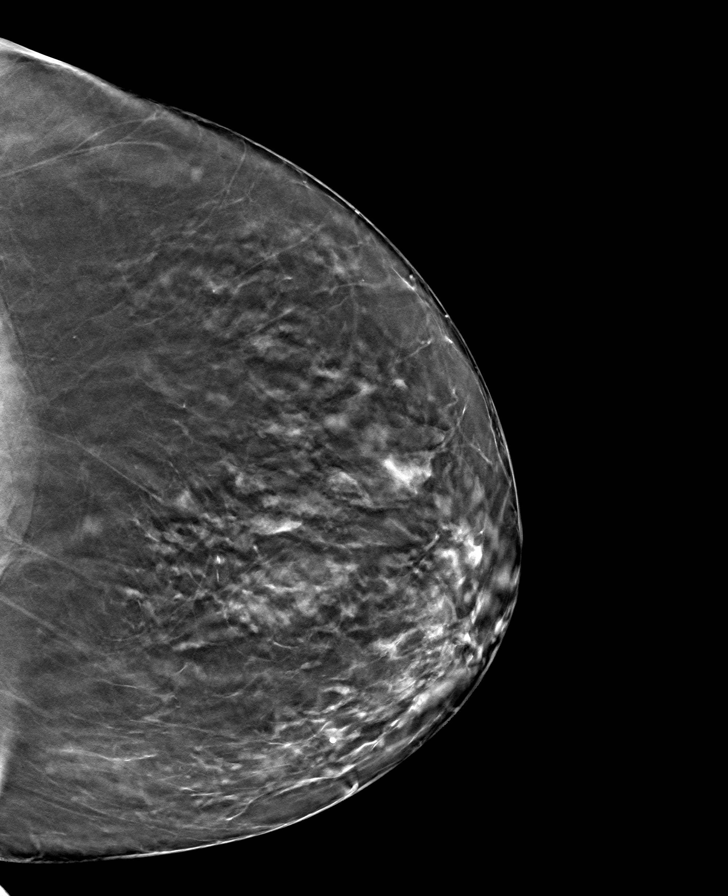

[R CC tomo · tomo slice 34/67.0]
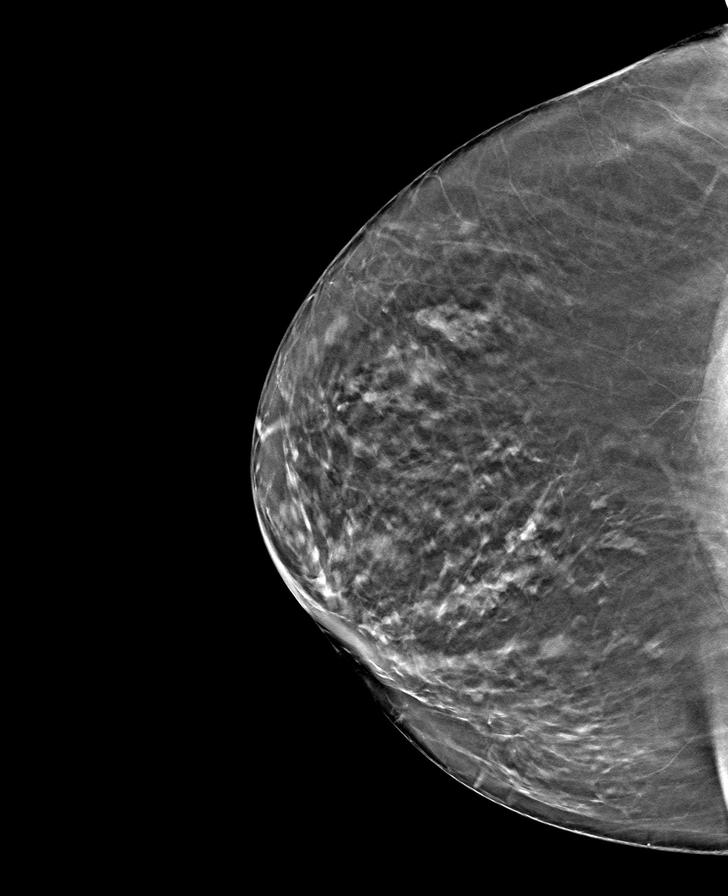

[8 of 24 positions shown; findings below may reference images not displayed]

ACR Breast Density Category c: The breast tissue is heterogeneously
dense, which may obscure small masses.
FINDINGS: There are no findings suspicious for malignancy.
IMPRESSION: No mammographic evidence of malignancy. A result letter of this
screening mammogram will be mailed directly to the patient.

RECOMMENDATION:
Screening mammogram in one year. (Code:Q3-W-BC3)

BI-RADS CATEGORY  1: Negative.

## 2024-07-07 ENCOUNTER — Ambulatory Visit: Admitting: Internal Medicine
